# Patient Record
Sex: Female | Born: 1960 | Race: White | Hispanic: No | Marital: Married | Smoking: Former smoker
Health system: Southern US, Community
[De-identification: ages and names within clinical notes are randomized; demographics above are authoritative.]

## PROBLEM LIST (undated history)

## (undated) DIAGNOSIS — J8283 Eosinophilic asthma: Secondary | ICD-10-CM

## (undated) DIAGNOSIS — M8580 Other specified disorders of bone density and structure, unspecified site: Secondary | ICD-10-CM

## (undated) DIAGNOSIS — K4190 Unilateral femoral hernia, without obstruction or gangrene, not specified as recurrent: Secondary | ICD-10-CM

## (undated) DIAGNOSIS — I7300 Raynaud's syndrome without gangrene: Secondary | ICD-10-CM

## (undated) DIAGNOSIS — Z8742 Personal history of other diseases of the female genital tract: Secondary | ICD-10-CM

## (undated) DIAGNOSIS — Z973 Presence of spectacles and contact lenses: Secondary | ICD-10-CM

## (undated) DIAGNOSIS — D7218 Eosinophilia in diseases classified elsewhere: Secondary | ICD-10-CM

## (undated) DIAGNOSIS — Z202 Contact with and (suspected) exposure to infections with a predominantly sexual mode of transmission: Secondary | ICD-10-CM

## (undated) HISTORY — PX: UPPER GI ENDOSCOPY: SHX6162

## (undated) HISTORY — DX: Presence of spectacles and contact lenses: Z97.3

## (undated) HISTORY — DX: Other specified disorders of bone density and structure, unspecified site: M85.80

## (undated) HISTORY — DX: Contact with and (suspected) exposure to infections with a predominantly sexual mode of transmission: Z20.2

## (undated) HISTORY — DX: Eosinophilia in diseases classified elsewhere: D72.18

## (undated) HISTORY — DX: Raynaud's syndrome without gangrene: I73.00

## (undated) HISTORY — DX: Eosinophilic asthma: J82.83

## (undated) HISTORY — DX: Personal history of other diseases of the female genital tract: Z87.42

---

## 2001-07-20 ENCOUNTER — Emergency Department (HOSPITAL_COMMUNITY): Admission: EM | Admit: 2001-07-20 | Discharge: 2001-07-20 | Payer: Self-pay | Admitting: Emergency Medicine

## 2003-06-27 ENCOUNTER — Emergency Department (HOSPITAL_COMMUNITY): Admission: EM | Admit: 2003-06-27 | Discharge: 2003-06-27 | Payer: Self-pay | Admitting: Emergency Medicine

## 2003-11-04 ENCOUNTER — Other Ambulatory Visit: Admission: RE | Admit: 2003-11-04 | Discharge: 2003-11-04 | Payer: Self-pay | Admitting: Family Medicine

## 2004-11-03 ENCOUNTER — Other Ambulatory Visit: Admission: RE | Admit: 2004-11-03 | Discharge: 2004-11-03 | Payer: Self-pay | Admitting: Family Medicine

## 2005-11-17 ENCOUNTER — Other Ambulatory Visit: Admission: RE | Admit: 2005-11-17 | Discharge: 2005-11-17 | Payer: Self-pay | Admitting: Family Medicine

## 2006-09-20 ENCOUNTER — Ambulatory Visit: Payer: Self-pay | Admitting: Family Medicine

## 2006-11-06 HISTORY — PX: INGUINAL HERNIA REPAIR: SUR1180

## 2006-11-27 ENCOUNTER — Other Ambulatory Visit: Admission: RE | Admit: 2006-11-27 | Discharge: 2006-11-27 | Payer: Self-pay | Admitting: Family Medicine

## 2006-11-27 ENCOUNTER — Ambulatory Visit: Payer: Self-pay | Admitting: Family Medicine

## 2007-08-01 ENCOUNTER — Ambulatory Visit: Payer: Self-pay | Admitting: Family Medicine

## 2007-10-25 ENCOUNTER — Ambulatory Visit (HOSPITAL_BASED_OUTPATIENT_CLINIC_OR_DEPARTMENT_OTHER): Admission: RE | Admit: 2007-10-25 | Discharge: 2007-10-25 | Payer: Self-pay | Admitting: Surgery

## 2008-11-04 ENCOUNTER — Ambulatory Visit: Payer: Self-pay | Admitting: Family Medicine

## 2008-11-04 ENCOUNTER — Other Ambulatory Visit: Admission: RE | Admit: 2008-11-04 | Discharge: 2008-11-04 | Payer: Self-pay | Admitting: Family Medicine

## 2008-11-10 ENCOUNTER — Encounter: Admission: RE | Admit: 2008-11-10 | Discharge: 2008-11-10 | Payer: Self-pay | Admitting: Family Medicine

## 2009-11-04 ENCOUNTER — Ambulatory Visit: Payer: Self-pay | Admitting: Family Medicine

## 2009-11-04 ENCOUNTER — Other Ambulatory Visit: Admission: RE | Admit: 2009-11-04 | Discharge: 2009-11-04 | Payer: Self-pay | Admitting: Family Medicine

## 2009-12-07 HISTORY — PX: COLONOSCOPY: SHX174

## 2010-10-27 ENCOUNTER — Other Ambulatory Visit
Admission: RE | Admit: 2010-10-27 | Discharge: 2010-10-27 | Payer: Self-pay | Source: Home / Self Care | Admitting: Family Medicine

## 2010-10-27 ENCOUNTER — Ambulatory Visit: Payer: Self-pay | Admitting: Family Medicine

## 2011-03-21 NOTE — Op Note (Signed)
NAME:  Kathryn Myers, Kathryn Myers                ACCOUNT NO.:  [id number]   MEDICAL RECORD NO.:  [id number]          PATIENT TYPE:  AMB   LOCATION:  DSC                          FACILITY:  [facility]   PHYSICIAN:  Wilmon Arms. Corliss Skains, M.D. DATE OF BIRTH:  1961/09/07   DATE OF PROCEDURE:  10/25/2007  DATE OF DISCHARGE:                               OPERATIVE REPORT   PREOPERATIVE DIAGNOSIS:  Left inguinal hernia.   POSTOPERATIVE DIAGNOSIS:  Left femoral hernia.   PROCEDURE PERFORMED:  Left groin exploration and left femoral hernia  repair with mesh.   SURGEON:  Wilmon Arms. Corliss Skains, M.D., FACS   ANESTHESIA:  General via LMA.   INDICATIONS:  The patient is a healthy 50 year old female who presents  with an enlarging bulge in her left groin.  This has become fairly  uncomfortable.  This does reduce partially when she is supine.  She is  very thin and very active and travels a lot and this is beginning to  interfere with her level of activity.  She denies any GI obstructive  symptoms.   DESCRIPTION OF PROCEDURE:  The patient is brought to the operating room,  placed in supine position on the operating room table.  After an  adequate level of general anesthesia was obtained, the patient's left  groin was shaved and prepped with Betadine and draped in sterile  fashion.  A time-out was taken to assure the proper patient and proper  procedure.  The area above the left inguinal ligament was infiltrated  with 25% Marcaine.  An oblique incision was made above the inguinal  ligament.  Dissection was carried down to the external oblique fascia.  The external oblique fascia was opened.  We began to bluntly dissect  around the round ligament.  However, it became obvious that there was no  hernia in this area.  We then began exploring below the inguinal  ligament.  A large subcutaneous bulge was identified.  We bluntly  dissected around this bulge.  The bulge seemed to contain fluid as well  as what appeared to be  omentum.  However, I could not dissect down to  the base of this hernia sac.  Therefore, we opened the hernia sac.  Some  peritoneal fluid was encountered.  We opened the sac wider and  encountered some omentum.  I was unable to reduce the omentum up through  a very small femoral hernia defect just behind the pubic ramus.  The  omentum was ligated between hemostats and tied with 2-0 silk sutures.  A  right-angle clamp was placed into the small hernia defect and I was able  to the enlarge it slightly.  This allowed me to reduce the remaining  omentum.  At this point, the hernia defect measured about 7 mm across.  Using a 3 inch x 6 inch piece of Ultrapro mesh, I cut a circle measuring  about 2 inches in diameter.  I formed a cone from this mesh and inserted  it into the hernia defect.  It was secured with three interrupted 2-0  Vicryl sutures.  We then used  the remainder of the mesh to cut an oval  shape that we placed over the muscles of the inguinal canal.  These were  secured with interrupted 2-0 Vicryl sutures.  The external oblique  fascia was reapproximated with 2-0 Vicryl.  The fascia over the femoral  canal  was also closed with Vicryl.  A 3-0 Vicryl was used to close the  subcutaneous tissues and 4-0 Monocryl was used to close the skin.  Steri-  Strips and clean dressings were applied.  The patient was then extubated  and brought to the recovery room in stable condition.  All sponge,  instrument and needle counts correct.      Wilmon Arms. Tsuei, M.D.  Electronically Signed     MKT/MEDQ  D:  10/25/2007  T:  10/27/2007  Job:  [id number]

## 2011-08-11 LAB — DIFFERENTIAL
Basophils Absolute: 0
Basophils Relative: 1
Eosinophils Absolute: 0 — ABNORMAL LOW
Eosinophils Relative: 1
Lymphocytes Relative: 47 — ABNORMAL HIGH
Lymphs Abs: 3
Monocytes Absolute: 0.3
Monocytes Relative: 5
Neutro Abs: 3
Neutrophils Relative %: 47

## 2011-08-11 LAB — CBC
HCT: 40.3
Hemoglobin: 13.9
MCHC: 34.4
MCV: 95.2
Platelets: 285
RBC: 4.23
RDW: 12.3
WBC: 6.4

## 2011-08-11 LAB — BASIC METABOLIC PANEL
BUN: 8
CO2: 28
Calcium: 9.2
Chloride: 103
Creatinine, Ser: 0.8
GFR calc Af Amer: >60
GFR calc non Af Amer: >60
Glucose, Bld: 118 — ABNORMAL HIGH
Potassium: 3.9
Sodium: 138

## 2011-08-11 LAB — POCT HEMOGLOBIN-HEMACUE: Hemoglobin: 14.7

## 2011-10-26 ENCOUNTER — Encounter: Payer: Self-pay | Admitting: Internal Medicine

## 2011-10-27 ENCOUNTER — Encounter: Payer: Self-pay | Admitting: Medical

## 2011-10-27 ENCOUNTER — Ambulatory Visit (INDEPENDENT_AMBULATORY_CARE_PROVIDER_SITE_OTHER): Payer: Managed Care, Other (non HMO) | Admitting: Medical

## 2011-10-27 DIAGNOSIS — Z23 Encounter for immunization: Secondary | ICD-10-CM

## 2011-10-27 DIAGNOSIS — S56912A Strain of unspecified muscles, fascia and tendons at forearm level, left arm, initial encounter: Secondary | ICD-10-CM | POA: Insufficient documentation

## 2011-10-27 DIAGNOSIS — IMO0002 Reserved for concepts with insufficient information to code with codable children: Secondary | ICD-10-CM

## 2011-10-27 DIAGNOSIS — Z0000 Encounter for general adult medical examination without abnormal findings: Secondary | ICD-10-CM | POA: Insufficient documentation

## 2011-10-27 DIAGNOSIS — Z202 Contact with and (suspected) exposure to infections with a predominantly sexual mode of transmission: Secondary | ICD-10-CM | POA: Insufficient documentation

## 2011-10-27 LAB — POCT URINALYSIS DIPSTICK
Bilirubin, UA: NEGATIVE
Blood, UA: NEGATIVE
Glucose, UA: NEGATIVE
Ketones, UA: NEGATIVE
Leukocytes, UA: NEGATIVE
Nitrite, UA: NEGATIVE
Protein, UA: NEGATIVE
Spec Grav, UA: 1.015
Urobilinogen, UA: NEGATIVE
pH, UA: 7.5

## 2011-10-27 NOTE — Progress Notes (Addendum)
Subjective:   HPI  Kathryn Myers is a 50 y.o. female who presents for a complete physical.  Been doing well.  She is out of town frequently with her job. She is fasting today.  She has mammogram scheduled in January.   Her LMP was 9/12, prior to that 7/12.  She denies family of self hx/o osteoporosis/osteopenia.  She is taking Ca+Vit D and daily multivitamin.   Her husband is HIV+ and they use condoms.  Last 3 pap smears have been normal.  Reviewed their medical, surgical, family, social, medication, and allergy history and updated chart as appropriate.  Past Medical History  Diagnosis Date  . Allergic rhinitis   . Hemorrhoids   . Hx of abnormal cervical Pap smear   . Wears glasses     Past Surgical History  Procedure Date  . Inguinal hernia repair 2008    left    Family History  Problem Relation Age of Onset  . Hyperlipidemia Mother   . Parkinsonism Father   . Hyperlipidemia Father   . Hypertension Father   . Heart disease Father   . Cancer Brother     bone cancer as a child  . Cancer Paternal Uncle     pancreatic  . Heart disease Maternal Grandfather   . Stroke Neg Hx   . Diabetes Neg Hx     History   Social History  . Marital Status: Married    Spouse Name: N/A    Number of Children: N/A  . Years of Education: N/A   Occupational History  . procurement supervisor    Social History Main Topics  . Smoking status: Former Smoker -- 0.2 packs/day for 10 years    Quit date: 10/27/1991  . Smokeless tobacco: Not on file  . Alcohol Use: 4.2 oz/week    7 Glasses of wine per week  . Drug Use: No  . Sexually Active: Not on file   Other Topics Concern  . Not on file   Social History Narrative   Married, no children, exercise - walks the dog, travels a lot for work, out of town most of the time    No current outpatient prescriptions on file prior to visit.    No Known Allergies  Review of Systems Constitutional: -fever, -chills, -sweats, -unexpected weight  change, -anorexia, -fatigue Allergy: -sneezing, -itching, -congestion Dermatology: denies changing moles, rash, lumps, new worrisome lesions ENT: -runny nose, -ear pain, -sore throat, -hoarseness, -sinus pain, -teeth pain, -tinnitus, -hearing loss, -epistaxis Cardiology:  -chest pain, -palpitations, -edema, -orthopnea, -paroxysmal nocturnal dyspnea Respiratory: -cough, -shortness of breath, -dyspnea on exertion, -wheezing, -hemoptysis Gastroenterology: -abdominal pain, -nausea, -vomiting, -diarrhea, -constipation, -blood in stool, -changes in bowel movement, -dysphagia Hematology: -bleeding or bruising problems Musculoskeletal: -arthralgias, -myalgias, +joint swelling, -back pain, -neck pain, -cramping, -gait changes Ophthalmology: -vision changes, -eye redness, -itching, -discharge Urology: -dysuria, -difficulty urinating, -hematuria, -urinary frequency, -urgency, incontinence Neurology: -headache, -weakness, -tingling, -numbness, -speech abnormality, -memory loss, -falls, -dizziness Psychology:  -depressed mood, -agitation, -sleep problems    Objective:   Physical Exam  Filed Vitals:   10/27/11 0849  BP: 112/80  Pulse: 68  Temp: 97.8 F (36.6 C)  Resp: 12    General appearance: alert, no distress, WD/WN, lean white female Skin: unremarkable, no worrisome lesions HEENT: normocephalic, conjunctiva/corneas normal, sclerae anicteric, PERRLA, EOMi, nares patent, no discharge or erythema, pharynx normal Oral cavity: MMM, tongue normal, teeth in good repair Neck: supple, no lymphadenopathy, no thyromegaly, no masses, normal ROM, no bruits  Chest: non tender, normal shape and expansion Heart: RRR, normal S1, S2, no murmurs Lungs: CTA bilaterally, no wheezes, rhonchi, or rales Abdomen: +bs, soft, non tender, non distended, no masses, no hepatomegaly, no splenomegaly, no bruits Back: non tender, normal ROM, no scoliosis Musculoskeletal: tender over left forearm just distal to lateral  elbow, pain with resisted ROM, otherwise upper extremities non tender, no obvious deformity, normal ROM throughout, lower extremities non tender, no obvious deformity, normal ROM throughout Extremities: no edema, no cyanosis, no clubbing Pulses: 2+ symmetric, upper and lower extremities, normal cap refill Neurological: alert, oriented x 3, CN2-12 intact, strength normal upper extremities and lower extremities, sensation normal throughout, DTRs 2+ throughout, no cerebellar signs, gait normal Psychiatric: normal affect, behavior normal, pleasant  Breast: nontender, no masses or lumps, no skin changes, no nipple discharge or inversion, no axillary lymphadenopathy Gyn: Normal external genitalia without lesions, vagina with normal mucosa, cervix without lesions, no cervical motion tenderness, no abnormal vaginal discharge.  Uterus and adnexa not enlarged, nontender, no masses.  Pap performed.  Exam chaperoned by nurse. Rectal: anus normal, occult negative brown stool   Assessment and Plan :     Encounter Diagnoses  Name Primary?  . General medical examination Yes  . Venereal disease contact   . Need for prophylactic vaccination and inoculation against influenza   . Strain of forearm, left     Physical exam - discussed healthy lifestyle, diet, exercise, preventative care, vaccinations, and addressed their concerns.  Handout given.  Colonoscopy 12/2009.  Venereal disease contact - HIV testing as her husband is HIV+.  Flu shot, vaccine counseling, and VIS given.   Strain of forearm - advised rest, OTC Aleve, consider OTZC tennis elbow strap, gentle ROM, and if worse or not computer, recheck.   Follow-up pending labs.

## 2011-10-27 NOTE — Patient Instructions (Signed)

## 2011-10-28 LAB — CBC WITH DIFFERENTIAL/PLATELET
Basophils Absolute: 0 K/uL (ref 0.0–0.1)
Basophils Relative: 1 % (ref 0–1)
Eosinophils Absolute: 0.1 K/uL (ref 0.0–0.7)
Eosinophils Relative: 1 % (ref 0–5)
HCT: 40.7 % (ref 36.0–46.0)
Hemoglobin: 14 g/dL (ref 12.0–15.0)
Lymphocytes Relative: 42 % (ref 12–46)
Lymphs Abs: 2.4 K/uL (ref 0.7–4.0)
MCH: 33.4 pg (ref 26.0–34.0)
MCHC: 34.4 g/dL (ref 30.0–36.0)
MCV: 97.1 fL (ref 78.0–100.0)
Monocytes Absolute: 0.4 K/uL (ref 0.1–1.0)
Monocytes Relative: 7 % (ref 3–12)
Neutro Abs: 2.9 K/uL (ref 1.7–7.7)
Neutrophils Relative %: 50 % (ref 43–77)
Platelets: 264 K/uL (ref 150–400)
RBC: 4.19 MIL/uL (ref 3.87–5.11)
RDW: 13.3 % (ref 11.5–15.5)
WBC: 5.8 K/uL (ref 4.0–10.5)

## 2011-10-28 LAB — HIV ANTIBODY (ROUTINE TESTING W REFLEX): HIV: NONREACTIVE

## 2011-10-28 LAB — COMPREHENSIVE METABOLIC PANEL WITH GFR
ALT: 14 U/L (ref 0–35)
AST: 21 U/L (ref 0–37)
Albumin: 4.6 g/dL (ref 3.5–5.2)
Alkaline Phosphatase: 52 U/L (ref 39–117)
BUN: 12 mg/dL (ref 6–23)
CO2: 26 meq/L (ref 19–32)
Calcium: 10.1 mg/dL (ref 8.4–10.5)
Chloride: 101 meq/L (ref 96–112)
Creat: 0.86 mg/dL (ref 0.50–1.10)
Glucose, Bld: 84 mg/dL (ref 70–99)
Potassium: 4.2 meq/L (ref 3.5–5.3)
Sodium: 140 meq/L (ref 135–145)
Total Bilirubin: 1 mg/dL (ref 0.3–1.2)
Total Protein: 7.2 g/dL (ref 6.0–8.3)

## 2011-10-28 LAB — LIPID PANEL
Cholesterol: 235 mg/dL — ABNORMAL HIGH (ref 0–200)
HDL: 85 mg/dL (ref >39)
LDL Cholesterol: 139 mg/dL — ABNORMAL HIGH (ref 0–99)
Total CHOL/HDL Ratio: 2.8 ratio
Triglycerides: 57 mg/dL (ref <150)
VLDL: 11 mg/dL (ref 0–40)

## 2011-11-03 ENCOUNTER — Telehealth: Payer: Self-pay | Admitting: Family Medicine

## 2011-11-03 NOTE — Telephone Encounter (Signed)
Message copied by Janeice Robinson on Fri Nov 03, 2011 11:09 AM ------      Message from: Jac Canavan      Created: Thu Nov 02, 2011  7:12 AM       Labs all look ok.  HIV negative, liver, kidney, blood counts, cholesterol, urine all ok.  I recommend she exercise regularly, eat healthy low fat diet, take daily calcium with vitamin D, take daily multivitamin, and continue to use condoms.  Lets reconsider bone density scan in a year.  At this point I don't think she has significant risk to do a bone density scan.  Certainly the recommendation is regular exercise and dietary Calcium + Vit D.            See if she has any questions.

## 2011-11-08 ENCOUNTER — Telehealth: Payer: Self-pay | Admitting: Family Medicine

## 2011-11-08 NOTE — Telephone Encounter (Signed)
Message copied by Janeice Robinson on Wed Nov 08, 2011  2:40 PM ------      Message from: Aleen Campi, DAVID S      Created: Thu Nov 02, 2011  7:12 AM       Labs all look ok.  HIV negative, liver, kidney, blood counts, cholesterol, urine all ok.  I recommend she exercise regularly, eat healthy low fat diet, take daily calcium with vitamin D, take daily multivitamin, and continue to use condoms.  Lets reconsider bone density scan in a year.  At this point I don't think she has significant risk to do a bone density scan.  Certainly the recommendation is regular exercise and dietary Calcium + Vit D.            See if she has any questions.

## 2011-11-08 NOTE — Telephone Encounter (Signed)
PATIENT WAS NOTIFIED OF HER LAB RESULTS. CLS   PATIENT STATES THAT SHE DIDN'T HAVE ANY OTHER QUESTIONS OR CONCERNS. CLS

## 2012-01-22 LAB — HM MAMMOGRAPHY: HM Mammogram: NEGATIVE

## 2012-01-23 ENCOUNTER — Encounter: Payer: Self-pay | Admitting: Internal Medicine

## 2012-08-30 ENCOUNTER — Other Ambulatory Visit (INDEPENDENT_AMBULATORY_CARE_PROVIDER_SITE_OTHER): Payer: 59

## 2012-08-30 ENCOUNTER — Telehealth: Payer: Self-pay | Admitting: Family Medicine

## 2012-08-30 DIAGNOSIS — Z23 Encounter for immunization: Secondary | ICD-10-CM

## 2012-08-30 NOTE — Telephone Encounter (Signed)
TSD  

## 2012-10-21 ENCOUNTER — Other Ambulatory Visit (HOSPITAL_COMMUNITY)
Admission: RE | Admit: 2012-10-21 | Discharge: 2012-10-21 | Disposition: A | Discharge status: Home / Self Care | Payer: 59 | Source: Ambulatory Visit | Attending: Obstetrics and Gynecology | Admitting: Obstetrics and Gynecology

## 2012-10-21 ENCOUNTER — Ambulatory Visit (INDEPENDENT_AMBULATORY_CARE_PROVIDER_SITE_OTHER): Payer: 59 | Admitting: Medical

## 2012-10-21 ENCOUNTER — Encounter: Payer: Self-pay | Admitting: Medical

## 2012-10-21 VITALS — BP 122/80 | HR 62 | Temp 98.0°F | Resp 16 | Ht 68.0 in | Wt 123.0 lb

## 2012-10-21 DIAGNOSIS — Z124 Encounter for screening for malignant neoplasm of cervix: Secondary | ICD-10-CM

## 2012-10-21 DIAGNOSIS — Z01419 Encounter for gynecological examination (general) (routine) without abnormal findings: Secondary | ICD-10-CM | POA: Insufficient documentation

## 2012-10-21 DIAGNOSIS — Z8262 Family history of osteoporosis: Secondary | ICD-10-CM

## 2012-10-21 DIAGNOSIS — Z2089 Contact with and (suspected) exposure to other communicable diseases: Secondary | ICD-10-CM

## 2012-10-21 DIAGNOSIS — A64 Unspecified sexually transmitted disease: Secondary | ICD-10-CM

## 2012-10-21 DIAGNOSIS — Z780 Asymptomatic menopausal state: Secondary | ICD-10-CM

## 2012-10-21 DIAGNOSIS — Z0000 Encounter for general adult medical examination without abnormal findings: Secondary | ICD-10-CM

## 2012-10-21 DIAGNOSIS — Z202 Contact with and (suspected) exposure to infections with a predominantly sexual mode of transmission: Secondary | ICD-10-CM

## 2012-10-21 LAB — CBC WITH DIFFERENTIAL/PLATELET
Basophils Absolute: 0.1 10*3/uL (ref 0.0–0.1)
Basophils Relative: 1 % (ref 0–1)
Eosinophils Absolute: 0 10*3/uL (ref 0.0–0.7)
Eosinophils Relative: 1 % (ref 0–5)
HCT: 40.4 % (ref 36.0–46.0)
Hemoglobin: 14.1 g/dL (ref 12.0–15.0)
Lymphocytes Relative: 44 % (ref 12–46)
Lymphs Abs: 3 10*3/uL (ref 0.7–4.0)
MCH: 32.7 pg (ref 26.0–34.0)
MCHC: 34.9 g/dL (ref 30.0–36.0)
MCV: 93.7 fL (ref 78.0–100.0)
Monocytes Absolute: 0.4 10*3/uL (ref 0.1–1.0)
Monocytes Relative: 5 % (ref 3–12)
Neutro Abs: 3.3 10*3/uL (ref 1.7–7.7)
Neutrophils Relative %: 49 % (ref 43–77)
Platelets: 279 10*3/uL (ref 150–400)
RBC: 4.31 MIL/uL (ref 3.87–5.11)
RDW: 13.2 % (ref 11.5–15.5)
WBC: 6.7 10*3/uL (ref 4.0–10.5)

## 2012-10-21 LAB — POCT URINALYSIS DIPSTICK
Bilirubin, UA: NEGATIVE
Glucose, UA: NEGATIVE
Leukocytes, UA: NEGATIVE
Nitrite, UA: NEGATIVE
Spec Grav, UA: 1.02
Urobilinogen, UA: NEGATIVE
pH, UA: 5

## 2012-10-21 NOTE — Progress Notes (Addendum)
Subjective:   HPI  Kathryn Myers is a 51 y.o. female who presents for a complete physical.  Last visit a year ago for the same.  Feeling healthy, no particularly c/o.  Travels internationally and throughout the [place] often for work.  Eats healthy, gets exercise. Sees dermatology twice yearly for surveillance.  LMP a year ago.  Had some hot flashes but these resolved.  No particular menopausal symptoms currently.   Preventative care: Last ophthalmology visit:01/2012 Last dental visit:10/22/12 Last colonoscopy:01/02/10 Last mammogram:01/22/12 Last gynecological exam:10/27/10 Last EKG:11/04/09 Last labs:10/2011  Prior vaccinations: TD or Tdap:08/2012 Influenza:08/30/12 Pneumococcal:N/A Shingles/Zostavax:N/A  Concerns: Wonders if she needs bone density screening.  Mother and MGM both with hx/o osteoporosis.    Reviewed their medical, surgical, family, social, medication, and allergy history and updated chart as appropriate.   Past Medical History  Diagnosis Date  . Allergic rhinitis   . Hemorrhoids   . Hx of abnormal cervical Pap smear   . Wears glasses   . Venereal disease contact     uses condoms, husband is HIV+  . H/O mammogram 01/22/12    normal    Past Surgical History  Procedure Date  . Inguinal hernia repair 2008    left  . Colonoscopy 12/2009    Dr. Elnoria Howard; sessile polyp, medium hemorrhoids, diverticulum    Family History  Problem Relation Age of Onset  . Hyperlipidemia Mother   . Osteoporosis Mother   . Parkinsonism Father   . Hyperlipidemia Father   . Hypertension Father   . Heart disease Father   . Cancer Brother     bone cancer as a child  . Cancer Paternal Uncle     pancreatic  . Heart disease Maternal Grandfather   . Stroke Neg Hx   . Diabetes Neg Hx   . Osteoporosis Maternal Grandmother      History   Social History  . Marital Status: Married    Spouse Name: N/A    Number of Children: N/A  . Years of Education: N/A   Occupational History  .  procurement supervisor    Social History Main Topics  . Smoking status: Former Smoker -- 0.2 packs/day for 10 years    Quit date: 10/27/1991  . Smokeless tobacco: Not on file  . Alcohol Use: 4.2 oz/week    7 Glasses of wine per week  . Drug Use: No  . Sexually Active: Not on file   Other Topics Concern  . 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  Travels to [place], [place], [place].      Current Outpatient Prescriptions on File Prior to Visit  Medication Sig Dispense Refill  . Multiple Vitamin (MULTIVITAMIN) tablet Take 1 tablet by mouth daily.          No Known Allergies   Review of Systems Constitutional: -fever, -chills, -sweats, -unexpected weight change, -decreased appetite, -fatigue Allergy: -sneezing, -itching, -congestion Dermatology: -changing moles, --rash, -lumps ENT: -runny nose, -ear pain, -sore throat, -hoarseness, -sinus pain, -teeth pain, - ringing in ears, -hearing loss, -nosebleeds Cardiology: -chest pain, -palpitations, -swelling, -difficulty breathing when lying flat, -waking up short of breath Respiratory: -cough, -shortness of breath, -difficulty breathing with exercise or exertion, -wheezing, -coughing up blood Gastroenterology: -abdominal pain, -nausea, -vomiting, -diarrhea, -constipation, -blood in stool, -changes in bowel movement, -difficulty swallowing or eating Hematology: -bleeding, -  bruising  Musculoskeletal: -joint aches, -muscle aches, -joint swelling, -back pain, -neck pain, -cramping, -changes in gait Ophthalmology: denies vision changes, eye redness, itching, discharge Urology: -burning with urination, -difficulty urinating, -blood in urine, -urinary frequency, -urgency, -incontinence Neurology: -headache, -weakness, -tingling, -numbness, -memory loss, -falls, -dizziness Psychology: -depressed  mood, -agitation, -sleep problems     Objective:   Physical Exam  Nurse notes and vitals reviewed.   General appearance: alert, no distress, WD/WN, lean white female  Skin: scattered macules, followed by dermatology, no specific worrisome lesion HEENT: normocephalic, conjunctiva/corneas normal, sclerae anicteric, PERRLA, EOMi, nares patent, no discharge or erythema, pharynx normal  Oral cavity: MMM, tongue normal, teeth in good repair  Neck: supple, no lymphadenopathy, no thyromegaly, no masses, normal ROM, no bruits  Chest: non tender, normal shape and expansion  Heart: RRR, normal S1, S2, no murmurs  Lungs: CTA bilaterally, no wheezes, rhonchi, or rales  Abdomen: +bs, soft, non tender, non distended, no masses, no hepatomegaly, no splenomegaly, no bruits  Back: non tender, normal ROM, no scoliosis  Musculoskeletal: tender over left forearm just distal to lateral elbow, pain with resisted ROM, otherwise upper extremities non tender, no obvious deformity, normal ROM throughout, lower extremities non tender, no obvious deformity, normal ROM throughout  Extremities: no edema, no cyanosis, no clubbing  Pulses: 2+ symmetric, upper and lower extremities, normal cap refill  Neurological: alert, oriented x 3, CN2-12 intact, strength normal upper extremities and lower extremities, sensation normal throughout, DTRs 2+ throughout, no cerebellar signs, gait normal  Psychiatric: normal affect, behavior normal, pleasant  Breast: nontender, no masses or lumps, no skin changes, no nipple discharge or inversion, no axillary lymphadenopathy  Gyn: Normal external genitalia without lesions, vagina with normal mucosa, cervix without lesions, no cervical motion tenderness, no abnormal vaginal discharge. Uterus and adnexa not enlarged, nontender, no masses. Pap performed. Exam chaperoned by nurse.  Rectal: anus normal, occult negative brown stool   Assessment and Plan :    Encounter Diagnoses  Name Primary?   . Routine general medical examination at a health care facility Yes  . Venereal disease contact   . Screening for cervical cancer   . Postmenopausal   . Family history of osteoporosis   . Venereal disease     Physical exam - discussed healthy lifestyle, diet, exercise, preventative care, vaccinations, and addressed their concerns.  Handout given.  HIV screening.  She continues to use condoms, husband HIV+ and she is HIV negative.    Pap sent.   Will set up for baseline bone density testing: risk factors include: white female, family history of osteoporosis, alcohol use, postmenopausal, small frame.  Follow-up pending labs.

## 2012-10-22 LAB — COMPREHENSIVE METABOLIC PANEL
ALT: 14 U/L (ref 0–35)
AST: 20 U/L (ref 0–37)
Albumin: 4.4 g/dL (ref 3.5–5.2)
Alkaline Phosphatase: 52 U/L (ref 39–117)
BUN: 15 mg/dL (ref 6–23)
CO2: 22 mEq/L (ref 19–32)
Calcium: 9.3 mg/dL (ref 8.4–10.5)
Chloride: 101 mEq/L (ref 96–112)
Creat: 0.72 mg/dL (ref 0.50–1.10)
Glucose, Bld: 81 mg/dL (ref 70–99)
Potassium: 3.9 mEq/L (ref 3.5–5.3)
Sodium: 137 mEq/L (ref 135–145)
Total Bilirubin: 0.9 mg/dL (ref 0.3–1.2)
Total Protein: 6.8 g/dL (ref 6.0–8.3)

## 2012-10-22 LAB — LIPID PANEL
Cholesterol: 232 mg/dL — ABNORMAL HIGH (ref 0–200)
HDL: 86 mg/dL (ref >39)
LDL Cholesterol: 132 mg/dL — ABNORMAL HIGH (ref 0–99)
Total CHOL/HDL Ratio: 2.7 Ratio
Triglycerides: 71 mg/dL (ref <150)
VLDL: 14 mg/dL (ref 0–40)

## 2012-10-22 LAB — VITAMIN D 25 HYDROXY (VIT D DEFICIENCY, FRACTURES): Vit D, 25-Hydroxy: 53 ng/mL (ref 30–89)

## 2012-10-22 LAB — TSH: TSH: 3.142 u[IU]/mL (ref 0.350–4.500)

## 2012-10-22 LAB — T4, FREE: Free T4: 1.35 ng/dL (ref 0.80–1.80)

## 2012-10-22 LAB — HIV ANTIBODY (ROUTINE TESTING W REFLEX): HIV: NONREACTIVE

## 2012-10-25 ENCOUNTER — Encounter: Payer: Self-pay | Admitting: Family Medicine

## 2012-11-06 DIAGNOSIS — M8580 Other specified disorders of bone density and structure, unspecified site: Secondary | ICD-10-CM

## 2012-11-06 HISTORY — DX: Other specified disorders of bone density and structure, unspecified site: M85.80

## 2012-11-08 ENCOUNTER — Telehealth: Payer: Self-pay | Admitting: Family Medicine

## 2012-11-08 ENCOUNTER — Other Ambulatory Visit: Payer: Self-pay | Admitting: Family Medicine

## 2012-11-08 DIAGNOSIS — Z780 Asymptomatic menopausal state: Secondary | ICD-10-CM

## 2012-11-08 NOTE — Telephone Encounter (Signed)
Patient is aware of her appointment at [facility] on 11/25/12 @ 915 am. [phone] CLS

## 2012-11-20 ENCOUNTER — Ambulatory Visit
Admission: RE | Admit: 2012-11-20 | Discharge: 2012-11-20 | Disposition: A | Discharge status: Home / Self Care | Payer: 59 | Source: Ambulatory Visit | Attending: Medical | Admitting: Medical

## 2012-11-20 DIAGNOSIS — Z780 Asymptomatic menopausal state: Secondary | ICD-10-CM

## 2012-11-20 LAB — HM DEXA SCAN

## 2012-11-25 ENCOUNTER — Other Ambulatory Visit: Payer: 59

## 2013-06-16 LAB — HM MAMMOGRAPHY

## 2013-06-17 ENCOUNTER — Encounter: Payer: Self-pay | Admitting: Internal Medicine

## 2013-09-11 ENCOUNTER — Other Ambulatory Visit: Payer: Self-pay

## 2013-10-27 ENCOUNTER — Ambulatory Visit (INDEPENDENT_AMBULATORY_CARE_PROVIDER_SITE_OTHER): Payer: 59 | Admitting: Medical

## 2013-10-27 ENCOUNTER — Encounter: Payer: Self-pay | Admitting: Medical

## 2013-10-27 VITALS — BP 110/80 | HR 60 | Temp 98.2°F | Resp 16 | Ht 68.0 in | Wt 122.0 lb

## 2013-10-27 DIAGNOSIS — Z0000 Encounter for general adult medical examination without abnormal findings: Secondary | ICD-10-CM

## 2013-10-27 DIAGNOSIS — Z129 Encounter for screening for malignant neoplasm, site unspecified: Secondary | ICD-10-CM

## 2013-10-27 DIAGNOSIS — Z2089 Contact with and (suspected) exposure to other communicable diseases: Secondary | ICD-10-CM

## 2013-10-27 DIAGNOSIS — Z202 Contact with and (suspected) exposure to infections with a predominantly sexual mode of transmission: Secondary | ICD-10-CM

## 2013-10-27 DIAGNOSIS — Z23 Encounter for immunization: Secondary | ICD-10-CM

## 2013-10-27 LAB — LIPID PANEL
Cholesterol: 242 mg/dL — ABNORMAL HIGH (ref 0–200)
HDL: 84 mg/dL (ref >39)
LDL Cholesterol: 144 mg/dL — ABNORMAL HIGH (ref 0–99)
Total CHOL/HDL Ratio: 2.9 Ratio
Triglycerides: 70 mg/dL (ref <150)
VLDL: 14 mg/dL (ref 0–40)

## 2013-10-27 LAB — COMPREHENSIVE METABOLIC PANEL
ALT: 15 U/L (ref 0–35)
AST: 24 U/L (ref 0–37)
Albumin: 4.4 g/dL (ref 3.5–5.2)
Alkaline Phosphatase: 80 U/L (ref 39–117)
BUN: 14 mg/dL (ref 6–23)
CO2: 30 mEq/L (ref 19–32)
Calcium: 10.4 mg/dL (ref 8.4–10.5)
Chloride: 100 mEq/L (ref 96–112)
Creat: 0.86 mg/dL (ref 0.50–1.10)
Glucose, Bld: 96 mg/dL (ref 70–99)
Potassium: 4.4 mEq/L (ref 3.5–5.3)
Sodium: 140 mEq/L (ref 135–145)
Total Bilirubin: 0.5 mg/dL (ref 0.3–1.2)
Total Protein: 7.1 g/dL (ref 6.0–8.3)

## 2013-10-27 LAB — POCT URINALYSIS DIPSTICK
Bilirubin, UA: NEGATIVE
Blood, UA: NEGATIVE
Glucose, UA: NEGATIVE
Ketones, UA: NEGATIVE
Leukocytes, UA: NEGATIVE
Nitrite, UA: NEGATIVE
Protein, UA: NEGATIVE
Spec Grav, UA: <=1.005
Urobilinogen, UA: NEGATIVE
pH, UA: 7

## 2013-10-27 LAB — CBC
HCT: 42.3 % (ref 36.0–46.0)
Hemoglobin: 14.5 g/dL (ref 12.0–15.0)
MCH: 32.3 pg (ref 26.0–34.0)
MCHC: 34.3 g/dL (ref 30.0–36.0)
MCV: 94.2 fL (ref 78.0–100.0)
Platelets: 291 10*3/uL (ref 150–400)
RBC: 4.49 MIL/uL (ref 3.87–5.11)
RDW: 13.2 % (ref 11.5–15.5)
WBC: 6.1 10*3/uL (ref 4.0–10.5)

## 2013-10-27 LAB — TSH: TSH: 3.072 u[IU]/mL (ref 0.350–4.500)

## 2013-10-27 NOTE — Progress Notes (Signed)
Subjective:   HPI  Kathryn Myers is a 52 y.o. female who presents for a complete physical.   Preventative care: Last ophthalmology visit:yes- last eye exam- 01/2013- Dr.Scott Last dental visit:yes Last colonoscopy: 01/02/2010 Last mammogram: 8 2014 Last gynecological exam:10/2012 Last EKG:11/04/2009 Last labs:2013  Prior vaccinations: TD or Tdap:08/2012 Influenza:10/27/13 Pneumococcal:n/a Shingles/Zostavax:n/a Other: bone density- 11/29/12 Sees dermatology - Dr. Terri Piedra once a year  Advanced directive:n/a Health care power of attorney:n/a Living will:yes  Concerns:none  Doing well, no c/o.   Wants routine yearly lab screening as well.   Exercises, eats healthy.  Reviewed their medical, surgical, family, social, medication, and allergy history and updated chart as appropriate.  Past Medical History  Diagnosis Date  . Allergic rhinitis   . Hemorrhoids   . Hx of abnormal cervical Pap smear   . Wears glasses   . Venereal disease contact     uses condoms, husband is HIV+  . H/O mammogram 01/22/12    normal  . Osteopenia 11/2012    repeat in 3-5 years    Past Surgical History  Procedure Laterality Date  . Inguinal hernia repair  2008    left  . Colonoscopy  12/2009    Dr. Elnoria Howard; sessile polyp, medium hemorrhoids, diverticulum    History   Social History  . Marital Status: Married    Spouse Name: N/A    Number of Children: N/A  . Years of Education: N/A   Occupational History  . procurement supervisor    Social History Main Topics  . Smoking status: Former Smoker -- 0.25 packs/day for 10 years    Quit date: 10/27/1991  . Smokeless tobacco: Not on file  . Alcohol Use: 4.2 oz/week    7 Glasses of wine per week  . Drug Use: No  . Sexual Activity: Not on file   Other Topics Concern  . 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  Travels to [place], [place], [place].      Family History  Problem Relation Age of Onset  . Hyperlipidemia Mother   . Osteoporosis Mother   . Parkinsonism Father   . Hyperlipidemia Father   . Hypertension Father   . Heart disease Father   . Cancer Brother     bone cancer as a child  . Cancer Paternal Uncle     pancreatic  . Heart disease Maternal Grandfather   . Stroke Neg Hx   . Diabetes Neg Hx   . Osteoporosis Maternal Grandmother     Current outpatient prescriptions:Multiple Vitamin (MULTIVITAMIN) tablet, Take 1 tablet by mouth daily.  , Disp: , Rfl:   No Known Allergies   Review of Systems Constitutional: -fever, -chills, -sweats, -unexpected weight change, -decreased appetite, -fatigue Allergy: -sneezing, -itching, -congestion Dermatology: -changing moles, --rash, -lumps ENT: -runny nose, -ear pain, -sore throat, -hoarseness, -sinus pain, -teeth pain, - ringing in ears, -hearing loss, -nosebleeds Cardiology: -chest pain, -palpitations, -swelling, -difficulty breathing when lying flat, -waking up short of breath Respiratory: -cough, -shortness of breath, -difficulty breathing with exercise or exertion, -wheezing, -coughing up blood Gastroenterology: -abdominal pain, -nausea, -vomiting, -diarrhea, -constipation, -blood in stool, -changes in bowel movement, -difficulty swallowing or eating Hematology: -bleeding, -bruising  Musculoskeletal: -joint aches, -muscle aches, -joint swelling, -back pain, -neck pain, -cramping, -changes in gait Ophthalmology: denies vision changes, eye redness, itching, discharge Urology: -  burning with urination, -difficulty urinating, -blood in urine, -urinary frequency, -urgency, -incontinence Neurology: -headache, -weakness, -tingling, -numbness, -memory loss, -falls, -dizziness Psychology: -depressed mood, -agitation, -sleep problems     Objective:   Physical Exam  BP 110/80  Pulse 60  Temp(Src) 98.2 F (36.8 C) (Oral)   Resp 16  Ht 5\' 8"  (1.727 m)  Wt 122 lb (55.339 kg)  BMI 18.55 kg/m2  General appearance: alert, no distress, WD/WN, lean white female Skin: scattered brown macules throughout, no particular worrisome lesions HEENT: normocephalic, conjunctiva/corneas normal, sclerae anicteric, PERRLA, EOMi, nares patent, no discharge or erythema, pharynx normal Oral cavity: MMM, tongue normal, teeth in good repair Neck: supple, no lymphadenopathy, no thyromegaly, no masses, normal ROM, no bruits Chest: non tender, normal shape and expansion Heart: RRR, normal S1, S2, no murmurs Lungs: CTA bilaterally, no wheezes, rhonchi, or rales Abdomen: +bs, soft, non tender, non distended, no masses, no hepatomegaly, no splenomegaly, no bruits Back: non tender, normal ROM, no scoliosis Musculoskeletal: upper extremities non tender, no obvious deformity, normal ROM throughout, lower extremities non tender, no obvious deformity, normal ROM throughout Extremities: no edema, no cyanosis, no clubbing Pulses: 2+ symmetric, upper and lower extremities, normal cap refill Neurological: alert, oriented x 3, CN2-12 intact, strength normal upper extremities and lower extremities, sensation normal throughout, DTRs 2+ throughout, no cerebellar signs, gait normal Psychiatric: normal affect, behavior normal, pleasant  Breast: nontender, no masses or lumps, no skin changes, no nipple discharge or inversion, no axillary lymphadenopathy Gyn: Normal external genitalia without lesions, vagina with normal mucosa, cervix without lesions, no cervical motion tenderness, no abnormal vaginal discharge.  Uterus and adnexa not enlarged, nontender, no masses.  Pap performed.  Exam chaperoned by nurse. Rectal: anus normal tone, occult negative stool, no worrisome lesions    Assessment and Plan :    Encounter Diagnoses  Name Primary?  . Routine general medical examination at a health care facility Yes  . Need for prophylactic vaccination and  inoculation against influenza   . Venereal disease contact   . Screening for cancer     Physical exam - discussed healthy lifestyle, diet, exercise, preventative care, vaccinations, and addressed their concerns.  Handout given. Counseled on the influenza virus vaccine.  Vaccine information sheet given.  Influenza vaccine given after consent obtained. Follow-up pending labs

## 2013-10-27 NOTE — Patient Instructions (Signed)

## 2013-10-28 LAB — RPR

## 2013-10-28 LAB — HIV ANTIBODY (ROUTINE TESTING W REFLEX): HIV: NONREACTIVE

## 2014-03-12 ENCOUNTER — Encounter: Payer: Self-pay | Admitting: Family Medicine

## 2014-03-12 ENCOUNTER — Ambulatory Visit (INDEPENDENT_AMBULATORY_CARE_PROVIDER_SITE_OTHER): Payer: 59 | Admitting: Family Medicine

## 2014-03-12 VITALS — BP 100/70 | HR 60 | Wt 118.0 lb

## 2014-03-12 DIAGNOSIS — M25529 Pain in unspecified elbow: Secondary | ICD-10-CM

## 2014-03-12 DIAGNOSIS — M67919 Unspecified disorder of synovium and tendon, unspecified shoulder: Secondary | ICD-10-CM

## 2014-03-12 DIAGNOSIS — M7581 Other shoulder lesions, right shoulder: Secondary | ICD-10-CM

## 2014-03-12 DIAGNOSIS — M719 Bursopathy, unspecified: Secondary | ICD-10-CM

## 2014-03-12 MED ORDER — LIDOCAINE HCL (PF) 2 % IJ SOLN
3.0000 mL | Freq: Once | INTRAMUSCULAR | Status: AC
  Administered 2014-03-12 (×2): 3 mL

## 2014-03-12 MED ORDER — TRIAMCINOLONE ACETONIDE 40 MG/ML IJ SUSP: 40.0000 mg | Freq: Once | INTRAMUSCULAR | Status: DC

## 2014-03-12 NOTE — Patient Instructions (Signed)
Return here if any problems.

## 2014-03-12 NOTE — Progress Notes (Signed)
   Subjective:    Patient ID: Kathryn Myers, female    DOB: 1960-12-24, 53 y.o.   MRN: [id number]  HPI  She is here for evaluation of right arm pain that started in February. At that time she was helping take care of her father which require a lot of physical work. She does not remember any particular incident. She notes that abduction does make this worse. She does occasionally wake up with pain if she lies on the right shoulder. She is right-handed   Review of Systems     Objective:   Physical Exam Full motion of the left shoulder however pain was elicited past horizontal. Negative drop arm test. Supraspinatus testing was negative. Neer's and Hawkins test is negative. No joint laxity noted.      Assessment & Plan:  Pain in joint, upper arm - Plan: lidocaine (XYLOCAINE) 2 % injection 3 mL, triamcinolone acetonide (KENALOG-40) injection 40 mg  Right rotator cuff tendinitis  explained options to her including conservative care with heat, rest and in safe. Also discussed injection. She would like to have the injection. 40 mg of Kenalog and 3 cc of Xylocaine was injected into the subacromial bursa, posterior lateral approach after Betadine was used to clean the skin. She did obtain partial relief of her symptoms within 5 minutes. Recommend returning here if symptoms recur.

## 2014-04-22 ENCOUNTER — Other Ambulatory Visit: Payer: Self-pay

## 2014-07-01 LAB — HM MAMMOGRAPHY

## 2014-07-03 ENCOUNTER — Encounter: Payer: Self-pay | Admitting: Internal Medicine

## 2014-11-02 ENCOUNTER — Encounter: Payer: Self-pay | Admitting: Medical

## 2014-11-02 ENCOUNTER — Other Ambulatory Visit (HOSPITAL_COMMUNITY)
Admission: RE | Admit: 2014-11-02 | Discharge: 2014-11-02 | Disposition: A | Discharge status: Home / Self Care | Payer: 59 | Source: Ambulatory Visit | Attending: Medical | Admitting: Medical

## 2014-11-02 ENCOUNTER — Ambulatory Visit (INDEPENDENT_AMBULATORY_CARE_PROVIDER_SITE_OTHER): Payer: 59 | Admitting: Medical

## 2014-11-02 VITALS — BP 110/80 | HR 56 | Temp 98.1°F | Resp 16 | Ht 68.0 in | Wt 116.0 lb

## 2014-11-02 DIAGNOSIS — Z1211 Encounter for screening for malignant neoplasm of colon: Secondary | ICD-10-CM

## 2014-11-02 DIAGNOSIS — Z01419 Encounter for gynecological examination (general) (routine) without abnormal findings: Secondary | ICD-10-CM | POA: Diagnosis present

## 2014-11-02 DIAGNOSIS — Z124 Encounter for screening for malignant neoplasm of cervix: Secondary | ICD-10-CM

## 2014-11-02 DIAGNOSIS — Z0000 Encounter for general adult medical examination without abnormal findings: Secondary | ICD-10-CM

## 2014-11-02 DIAGNOSIS — Z7185 Encounter for immunization safety counseling: Secondary | ICD-10-CM

## 2014-11-02 DIAGNOSIS — Z23 Encounter for immunization: Secondary | ICD-10-CM

## 2014-11-02 DIAGNOSIS — Z7189 Other specified counseling: Secondary | ICD-10-CM

## 2014-11-02 DIAGNOSIS — M8580 Other specified disorders of bone density and structure, unspecified site: Secondary | ICD-10-CM

## 2014-11-02 LAB — COMPREHENSIVE METABOLIC PANEL
ALT: 16 U/L (ref 0–35)
AST: 22 U/L (ref 0–37)
Albumin: 4.4 g/dL (ref 3.5–5.2)
Alkaline Phosphatase: 62 U/L (ref 39–117)
BUN: 14 mg/dL (ref 6–23)
CO2: 28 mEq/L (ref 19–32)
Calcium: 9.3 mg/dL (ref 8.4–10.5)
Chloride: 102 mEq/L (ref 96–112)
Creat: 0.8 mg/dL (ref 0.50–1.10)
Glucose, Bld: 87 mg/dL (ref 70–99)
Potassium: 4.1 mEq/L (ref 3.5–5.3)
Sodium: 139 mEq/L (ref 135–145)
Total Bilirubin: 0.9 mg/dL (ref 0.2–1.2)
Total Protein: 6.7 g/dL (ref 6.0–8.3)

## 2014-11-02 LAB — LIPID PANEL
Cholesterol: 224 mg/dL — ABNORMAL HIGH (ref 0–200)
HDL: 82 mg/dL (ref >39)
LDL Cholesterol: 126 mg/dL — ABNORMAL HIGH (ref 0–99)
Total CHOL/HDL Ratio: 2.7 Ratio
Triglycerides: 78 mg/dL (ref <150)
VLDL: 16 mg/dL (ref 0–40)

## 2014-11-02 LAB — POCT URINALYSIS DIPSTICK
Bilirubin, UA: NEGATIVE
Blood, UA: NEGATIVE
Glucose, UA: NEGATIVE
Ketones, UA: NEGATIVE
Leukocytes, UA: NEGATIVE
Nitrite, UA: NEGATIVE
Protein, UA: NEGATIVE
Spec Grav, UA: 1.025
Urobilinogen, UA: NEGATIVE
pH, UA: 6

## 2014-11-02 NOTE — Patient Instructions (Signed)
Thank you for giving me the opportunity to serve you today.    Your diagnosis today includes: Encounter Diagnoses  Name Primary?  . Encounter for health maintenance examination in adult Yes  . Screening for cervical cancer   . Special screening for malignant neoplasms, colon   . Need for prophylactic vaccination and inoculation against influenza   . Vaccine counseling   . Osteopenia      Specific recommendations today include:  Check with Dr. Ulyses Amor office about when to repeat colonscopy.  You could be due as soon as 12/2014.  Check with the health dept about updated Typhoid vaccine  We updated your flu shot today  We will call with lab results  Return pending labs.   I have included other useful information below for your review.  Osteoporosis Throughout your life, your body breaks down old bone and replaces it with new bone. As you get older, your body does not replace bone as quickly as it breaks it down. By the age of 50 years, most people begin to gradually lose bone because of the imbalance between bone loss and replacement. Some people lose more bone than others. Bone loss beyond a specified normal degree is considered osteoporosis.  Osteoporosis affects the strength and durability of your bones. The inside of the ends of your bones and your flat bones, like the bones of your pelvis, look like honeycomb, filled with tiny open spaces. As bone loss occurs, your bones become less dense. This means that the open spaces inside your bones become bigger and the walls between these spaces become thinner. This makes your bones weaker. Bones of a person with osteoporosis can become so weak that they can break (fracture) during minor accidents, such as a simple fall. CAUSES  The following factors have been associated with the development of osteoporosis:  Smoking.  Drinking more than 2 alcoholic drinks several days per week.  Long-term use of certain  medicines:  Corticosteroids.  Chemotherapy medicines.  Thyroid medicines.  Antiepileptic medicines.  Gonadal hormone suppression medicine.  Immunosuppression medicine.  Being underweight.  Lack of physical activity.  Lack of exposure to the sun. This can lead to vitamin D deficiency.  Certain medical conditions:  Certain inflammatory bowel diseases, such as Crohn disease and ulcerative colitis.  Diabetes.  Hyperthyroidism.  Hyperparathyroidism. RISK FACTORS Anyone can develop osteoporosis. However, the following factors can increase your risk of developing osteoporosis:  Gender--Women are at higher risk than men.  Age--Being older than 50 years increases your risk.  Ethnicity--White and Asian people have an increased risk.  Weight --Being extremely underweight can increase your risk of osteoporosis.  Family history of osteoporosis--Having a family member who has developed osteoporosis can increase your risk. SYMPTOMS  Usually, people with osteoporosis have no symptoms.  DIAGNOSIS  Signs during a physical exam that may prompt your caregiver to suspect osteoporosis include:  Decreased height. This is usually caused by the compression of the bones that form your spine (vertebrae) because they have weakened and become fractured.  A curving or rounding of the upper back (kyphosis). To confirm signs of osteoporosis, your caregiver may request a procedure that uses 2 low-dose X-ray beams with different levels of energy to measure your bone mineral density (dual-energy X-ray absorptiometry [DXA]). Also, your caregiver may check your level of vitamin D. TREATMENT  The goal of osteoporosis treatment is to strengthen bones in order to decrease the risk of bone fractures. There are different types of medicines available to help  achieve this goal. Some of these medicines work by slowing the processes of bone loss. Some medicines work by increasing bone density. Treatment also  involves making sure that your levels of calcium and vitamin D are adequate. PREVENTION  There are things you can do to help prevent osteoporosis. Adequate intake of calcium and vitamin D can help you achieve optimal bone mineral density. Regular exercise can also help, especially resistance and weight-bearing activities. If you smoke, quitting smoking is an important part of osteoporosis prevention. MAKE SURE YOU:  Understand these instructions.  Will watch your condition.  Will get help right away if you are not doing well or get worse. FOR MORE INFORMATION www.osteo.org and EquipmentWeekly.com.ee Document Released: 08/02/2005 Document Revised: 02/17/2013 Document Reviewed: 10/07/2011 [facility] Patient Information 2015 [place], [place]. This information is not intended to replace advice given to you by your health care provider. Make sure you discuss any questions you have with your health care provider.

## 2014-11-02 NOTE — Progress Notes (Signed)
Subjective:   HPI  Kathryn Myers is a 53 y.o. female who presents for a complete physical.  Medical care team includes: [facility] Dr. Benson Norway, GI Dorothea Ogle PA here with Dr. Jill Alexanders   Preventative care: Last ophthalmology visit:YES LAST EYE EXAM 01/2014 Last dental visit:YES DR. POOLE Last colonoscopy:03/2014 Last mammogram:06/2014 Last gynecological exam:2013 Last EKG:10/2009 Last labs:2014  Prior vaccinations: TD or Tdap:2013 Influenza:11/02/2014  Concerns: Having some right shoulder pains.  Saw Dr. Redmond School back in May, had shot.  She notes hx/o hernia surgery, left inguinal region.   Lately some intermittent discomfort over thanksgiving, but resolved currently.  Reviewed their medical, surgical, family, social, medication, and allergy history and updated chart as appropriate.  Past Medical History  Diagnosis Date  . Allergic rhinitis   . Hemorrhoids   . Hx of abnormal cervical Pap smear   . Wears glasses   . Venereal disease contact     uses condoms, husband is HIV+  . H/O mammogram 07/01/14    normal  . Osteopenia 11/2012    repeat in 3-5 years    Past Surgical History  Procedure Laterality Date  . Inguinal hernia repair  2008    left  . Colonoscopy  12/2009    Dr. Benson Norway; sessile polyp, medium hemorrhoids, diverticulum    History   Social History  . Marital Status: Married    Spouse Name: N/A    Number of Children: N/A  . Years of Education: N/A   Occupational History  . procurement supervisor    Social History Main Topics  . Smoking status: Former Smoker -- 0.25 packs/day for 10 years    Quit date: 10/27/1991  . Smokeless tobacco: Not on file  . Alcohol Use: 4.2 oz/week    7 Glasses of wine per week  . Drug Use: No  . Sexual Activity: Not on file   Other Topics Concern  . 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  Travels to [place], [place], [place].      Family History  Problem Relation Age of Onset  . Hyperlipidemia Mother   . Osteoporosis Mother   . Parkinsonism Father   . Hyperlipidemia Father   . Hypertension Father   . Heart disease Father   . Cancer Brother     bone cancer as a child  . Cancer Paternal Uncle     pancreatic  . Heart disease Maternal Grandfather   . Stroke Neg Hx   . Diabetes Neg Hx   . Osteoporosis Maternal Grandmother     Current outpatient prescriptions: aspirin 81 MG tablet, Take 81 mg by mouth daily., Disp: , Rfl: ;  calcium-vitamin D (OSCAL WITH D) 500-200 MG-UNIT per tablet, Take 1 tablet by mouth., Disp: , Rfl: ;  Multiple Vitamin (MULTIVITAMIN) tablet, Take 1 tablet by mouth daily.  , Disp: , Rfl:  Current facility-administered medications: triamcinolone acetonide (KENALOG-40) injection 40 mg, 40 mg, Intramuscular, Once, Denita Lung, MD  No Known Allergies   Review of Systems Constitutional: -fever, -chills, -sweats, -unexpected weight change, -decreased appetite, -fatigue Allergy: -sneezing, -itching, -congestion Dermatology: -changing moles, --rash, -lumps ENT: -runny nose, -ear pain, -sore throat, -hoarseness, -sinus pain, -teeth pain, - ringing in ears, -hearing loss, -nosebleeds Cardiology: -chest pain, -palpitations, -swelling, -difficulty breathing when lying flat, -waking up short of  breath Respiratory: -cough, -shortness of breath, -difficulty breathing with exercise or exertion, -wheezing, -coughing up blood Gastroenterology: -abdominal pain, -nausea, -vomiting, -diarrhea, -constipation, -blood in stool, -changes in bowel movement, -difficulty swallowing or eating Hematology: -bleeding, -bruising  Musculoskeletal: +joint aches(RIGHT SHOULDER), -muscle aches, -joint swelling, -back pain, -neck pain, -cramping, -changes in gait Ophthalmology: denies vision changes, eye redness, itching,  discharge Urology: -burning with urination, -difficulty urinating, -blood in urine, -urinary frequency, -urgency, -incontinence Neurology: -headache, -weakness, -tingling, -numbness, -memory loss, -falls, -dizziness Psychology: -depressed mood, -agitation, -sleep problems     Objective:   Physical Exam  BP 110/80 mmHg  Pulse 56  Temp(Src) 98.1 F (36.7 C) (Oral)  Resp 16  Ht 5\' 8"  (1.727 m)  Wt 116 lb (52.617 kg)  BMI 17.64 kg/m2  General appearance: alert, no distress, WD/WN, lean white female Skin: scattered brown macules throughout, no particular worrisome lesions, surgical scars of right mid and lower left abdomen from prior dermatology biopsies HEENT: normocephalic, conjunctiva/corneas normal, sclerae anicteric, PERRLA, EOMi, nares patent, no discharge or erythema, pharynx normal Oral cavity: MMM, tongue normal, teeth in good repair Neck: supple, no lymphadenopathy, no thyromegaly, no masses, normal ROM, no bruits Chest: non tender, normal shape and expansion Heart: RRR, normal S1, S2, no murmurs Lungs: CTA bilaterally, no wheezes, rhonchi, or rales Abdomen: +bs, soft, LLQ surgical scar/inguinal scar, no obvious hernia or mass or tenderness, otherwise non tender, non distended, no masses, no hepatomegaly, no splenomegaly, no bruits Back: non tender, normal ROM, no scoliosis Musculoskeletal: mild pain with right shoulder abduction over 90 degrees, but otherwise nontender,normal ROM, otherwise extremities non tender, no obvious deformity, normal ROM throughout, lower extremities non tender, no obvious deformity, normal ROM throughout Extremities: no edema, no cyanosis, no clubbing Pulses: 2+ symmetric, upper and lower extremities, normal cap refill Neurological: alert, oriented x 3, CN2-12 intact, strength normal upper extremities and lower extremities, sensation normal throughout, DTRs 2+ throughout, no cerebellar signs, gait normal Psychiatric: normal affect, behavior normal,  pleasant  Breast: nontender, few scattered 1-57mm nodules left mid lower breast at 6 oclock and left upper breast at 1 oclock unchanged per patient, no skin changes, no nipple discharge or inversion, no axillary lymphadenopathy Gyn: Normal external genitalia without lesions, vagina with normal mucosa, cervix without lesions, no cervical motion tenderness, no abnormal vaginal discharge. Uterus and adnexa not enlarged, nontender, no masses. Pap performed. Exam chaperoned by nurse. Rectal: anus normal tone, occult negative stool, no worrisome lesions   Assessment and Plan :    Encounter Diagnoses  Name Primary?  . Encounter for health maintenance examination in adult Yes  . Screening for cervical cancer   . Special screening for malignant neoplasms, colon   . Need for prophylactic vaccination and inoculation against influenza   . Vaccine counseling   . Osteopenia     Physical exam - discussed healthy lifestyle, diet, exercise, preventative care, vaccinations, and addressed their concerns.  Handout given. We will call with lab results Counseled on the influenza virus vaccine.  Vaccine information sheet given.  Influenza vaccine given after consent obtained. We reviewed back over 11/2012 bone density scan.  C/t vit D/calcium daily, healthy diet, weight bearing exercise.  Check with Dr. Ulyses Amor office about when to repeat colonoscopy.  You could be due as soon as 12/2014. Check with the health dept about updated Typhoid vaccine Follow-up pending labs.

## 2014-11-03 LAB — CBC
HCT: 43.9 % (ref 36.0–46.0)
Hemoglobin: 14.5 g/dL (ref 12.0–15.0)
MCH: 31.9 pg (ref 26.0–34.0)
MCHC: 33 g/dL (ref 30.0–36.0)
MCV: 96.7 fL (ref 78.0–100.0)
MPV: 9.6 fL (ref 9.4–12.4)
Platelets: 293 10*3/uL (ref 150–400)
RBC: 4.54 MIL/uL (ref 3.87–5.11)
RDW: 13.3 % (ref 11.5–15.5)
WBC: 5.6 10*3/uL (ref 4.0–10.5)

## 2014-11-03 LAB — VITAMIN D 25 HYDROXY (VIT D DEFICIENCY, FRACTURES): Vit D, 25-Hydroxy: 41 ng/mL (ref 30–100)

## 2014-11-03 LAB — HIV ANTIBODY (ROUTINE TESTING W REFLEX): HIV 1&2 Ab, 4th Generation: NONREACTIVE

## 2014-11-04 LAB — CYTOLOGY - PAP

## 2015-07-19 ENCOUNTER — Encounter: Payer: Self-pay | Admitting: Medical

## 2015-07-21 LAB — HM MAMMOGRAPHY: HM Mammogram: NEGATIVE

## 2015-10-25 ENCOUNTER — Telehealth: Payer: Self-pay | Admitting: Medical

## 2015-10-25 ENCOUNTER — Ambulatory Visit (INDEPENDENT_AMBULATORY_CARE_PROVIDER_SITE_OTHER): Payer: 59 | Admitting: Medical

## 2015-10-25 ENCOUNTER — Encounter: Payer: Self-pay | Admitting: Medical

## 2015-10-25 VITALS — BP 132/90 | HR 65 | Ht 68.0 in | Wt 117.0 lb

## 2015-10-25 DIAGNOSIS — Z681 Body mass index (BMI) 19 or less, adult: Secondary | ICD-10-CM | POA: Diagnosis not present

## 2015-10-25 DIAGNOSIS — Z202 Contact with and (suspected) exposure to infections with a predominantly sexual mode of transmission: Secondary | ICD-10-CM

## 2015-10-25 DIAGNOSIS — R10814 Left lower quadrant abdominal tenderness: Secondary | ICD-10-CM

## 2015-10-25 DIAGNOSIS — M8580 Other specified disorders of bone density and structure, unspecified site: Secondary | ICD-10-CM

## 2015-10-25 DIAGNOSIS — J309 Allergic rhinitis, unspecified: Secondary | ICD-10-CM

## 2015-10-25 DIAGNOSIS — R609 Edema, unspecified: Secondary | ICD-10-CM

## 2015-10-25 DIAGNOSIS — Z0000 Encounter for general adult medical examination without abnormal findings: Secondary | ICD-10-CM | POA: Diagnosis not present

## 2015-10-25 DIAGNOSIS — R1032 Left lower quadrant pain: Secondary | ICD-10-CM

## 2015-10-25 DIAGNOSIS — Z23 Encounter for immunization: Secondary | ICD-10-CM | POA: Diagnosis not present

## 2015-10-25 LAB — COMPREHENSIVE METABOLIC PANEL
ALT: 16 U/L (ref 6–29)
AST: 23 U/L (ref 10–35)
Albumin: 4.2 g/dL (ref 3.6–5.1)
Alkaline Phosphatase: 54 U/L (ref 33–130)
BUN: 10 mg/dL (ref 7–25)
CO2: 28 mmol/L (ref 20–31)
Calcium: 9.2 mg/dL (ref 8.6–10.4)
Chloride: 100 mmol/L (ref 98–110)
Creat: 0.64 mg/dL (ref 0.50–1.05)
Glucose, Bld: 80 mg/dL (ref 65–99)
Potassium: 4 mmol/L (ref 3.5–5.3)
Sodium: 136 mmol/L (ref 135–146)
Total Bilirubin: 0.8 mg/dL (ref 0.2–1.2)
Total Protein: 7.1 g/dL (ref 6.1–8.1)

## 2015-10-25 LAB — POCT URINALYSIS DIPSTICK
Bilirubin, UA: NEGATIVE
Blood, UA: NEGATIVE
Glucose, UA: NEGATIVE
Leukocytes, UA: NEGATIVE
Nitrite, UA: NEGATIVE
Protein, UA: NEGATIVE
Spec Grav, UA: 1.02
Urobilinogen, UA: 0.2
pH, UA: 7

## 2015-10-25 LAB — LIPID PANEL
Cholesterol: 218 mg/dL — ABNORMAL HIGH (ref 125–200)
HDL: 91 mg/dL (ref >=46)
LDL Cholesterol: 113 mg/dL (ref <130)
Total CHOL/HDL Ratio: 2.4 Ratio (ref <=5.0)
Triglycerides: 69 mg/dL (ref <150)
VLDL: 14 mg/dL (ref <30)

## 2015-10-25 LAB — CBC
HCT: 38.4 % (ref 36.0–46.0)
Hemoglobin: 13.4 g/dL (ref 12.0–15.0)
MCH: 32.8 pg (ref 26.0–34.0)
MCHC: 34.9 g/dL (ref 30.0–36.0)
MCV: 93.9 fL (ref 78.0–100.0)
MPV: 9.6 fL (ref 8.6–12.4)
Platelets: 262 K/uL (ref 150–400)
RBC: 4.09 MIL/uL (ref 3.87–5.11)
RDW: 13.5 % (ref 11.5–15.5)
WBC: 6.8 K/uL (ref 4.0–10.5)

## 2015-10-25 NOTE — Telephone Encounter (Signed)
Please set up pelvic ultrasound for LLQ abdominal pain.  Please set up bone density screen for [facility].  Do both within the first 2 weeks of January.

## 2015-10-25 NOTE — Telephone Encounter (Signed)
Faxed over to [facility] for bone density. [place] is scheduled for Tuesday Jan 3rd 8:25 am. Nothing by mouth after midnight. Left detailed message about appt on pts voicemail.

## 2015-10-25 NOTE — Progress Notes (Signed)
Subjective:   HPI  Kathryn Myers is a 54 y.o. female who presents for a complete physical.  Chief Complaint  Patient presents with  . Annual Exam    fasting. urine obtained. sees eye doc yearly. sees [facility]. [facility]. no other doctors. will get flu shot today. thinks she may be getting a hernia?     Medical care team includes: [facility] twice [facility], just saw them recently Dr. Benson Norway, GI Dorothea Ogle PA here with Dr. Jill Alexanders  Concerns: Hernia concern - hx/o left inguinal repair but recently feels discomfort in LLQ, but no GI or GU or pelvic symptoms otherwise.      [facility] them recently.  Given her frequent travel, international travel, gets some lower leg swelling somewhat regularly.  [facility] told her to use compression hose.  Nothing surgical at this time.  Reviewed their medical, surgical, family, social, medication, and allergy history and updated chart as appropriate.  Past Medical History  Diagnosis Date  . Allergic rhinitis   . Hemorrhoids   . Hx of abnormal cervical Pap smear   . Wears glasses   . Venereal disease contact     uses condoms, husband is HIV+  . H/O mammogram 07/01/14    normal  . Osteopenia 11/2012    repeat in 3-5 years    Past Surgical History  Procedure Laterality Date  . Inguinal hernia repair  2008    left  . Colonoscopy  12/2009    Dr. Benson Norway; sessile polyp, medium hemorrhoids, diverticulum    Social History   Social History  . Marital Status: Married    Spouse Name: N/A  . Number of Children: N/A  . Years of Education: N/A   Occupational History  . procurement supervisor    Social History Main Topics  . Smoking status: Former Smoker -- 0.25 packs/day for 10 years    Quit date: 10/27/1991  . Smokeless tobacco: Not on file  . Alcohol Use: 4.2 oz/week    7 Glasses of wine per week  . Drug Use: No  . Sexual Activity: Not on file   Other Topics  Concern  . 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  Travels to [place], [place], [place].      Family History  Problem Relation Age of Onset  . Hyperlipidemia Mother   . Osteoporosis Mother   . Parkinsonism Father   . Hyperlipidemia Father   . Hypertension Father   . Heart disease Father   . Cancer Brother     bone cancer as a child  . Cancer Paternal Uncle     pancreatic  . Heart disease Maternal Grandfather   . Stroke Neg Hx   . Diabetes Neg Hx   . Osteoporosis Maternal Grandmother      Current outpatient prescriptions:  .  aspirin 81 MG tablet, Take 81 mg by mouth daily., Disp: , Rfl:  .  calcium-vitamin D (OSCAL WITH D) 500-200 MG-UNIT per tablet, Take 1 tablet by mouth., Disp: , Rfl:  .  Omega-3 Fatty Acids (FISH OIL PO), Take by mouth daily., Disp: , Rfl:  .  Multiple Vitamin (MULTIVITAMIN) tablet, Take 1 tablet by mouth daily.  , Disp: , Rfl:   Current facility-administered medications:  .  triamcinolone acetonide (KENALOG-40) injection 40 mg, 40 mg, Intramuscular, Once, Denita Lung, MD  No Known Allergies   Review of Systems Constitutional: -fever, -chills, -sweats, -unexpected weight change, -decreased appetite, -fatigue Allergy: -sneezing, -itching, -congestion Dermatology: -changing moles, --rash, -lumps ENT: -runny nose, -ear pain, -sore throat, -hoarseness, -sinus pain, -teeth pain, - ringing in ears, -hearing loss, -nosebleeds Cardiology: -chest pain, -palpitations, -swelling, -difficulty breathing when lying flat, -waking up short of breath Respiratory: -cough, -shortness of breath, -difficulty breathing with exercise or exertion, -wheezing, -coughing up blood Gastroenterology: -abdominal pain, -nausea, -vomiting, -diarrhea, -constipation, -blood in stool, -changes in bowel movement, -difficulty swallowing or  eating Hematology: -bleeding, -bruising  Musculoskeletal: -joint aches, -muscle aches, -joint swelling, -back pain, -neck pain, -cramping, -changes in gait Ophthalmology: denies vision changes, eye redness, itching, discharge Urology: -burning with urination, -difficulty urinating, -blood in urine, -urinary frequency, -urgency, -incontinence Neurology: -headache, -weakness, -tingling, -numbness, -memory loss, -falls, -dizziness Psychology: -depressed mood, -agitation, -sleep problems     Objective:   Physical Exam  BP 132/90 mmHg  Pulse 65  Ht 5\' 8"  (1.727 m)  Wt 117 lb (53.071 kg)  BMI 17.79 kg/m2  General appearance: alert, no distress, WD/WN, lean white female Skin: scattered brown macules throughout, no particular worrisome lesions, surgical scars of right mid and lower left abdomen and upper left back from prior dermatology biopsies HEENT: normocephalic, conjunctiva/corneas normal, sclerae anicteric, PERRLA, EOMi, nares patent, no discharge or erythema, pharynx normal Oral cavity: MMM, tongue normal, teeth in good repair Neck: supple, no lymphadenopathy, no thyromegaly, no masses, normal ROM, no bruits Chest: non tender, normal shape and expansion Heart: RRR, normal S1, S2, no murmurs Lungs: CTA bilaterally, no wheezes, rhonchi, or rales Abdomen: +bs, soft, LLQ surgical scar/inguinal scar, no obvious hernia or mass, mild LLQ tenderness, otherwise non tender, non distended, no masses, no hepatomegaly, no splenomegaly, no bruits Back: non tender, normal ROM, no scoliosis Musculoskeletal: mild pain with right shoulder abduction over 90 degrees, but otherwise nontender,normal ROM, otherwise extremities non tender, no obvious deformity, normal ROM throughout, lower extremities non tender, no obvious deformity, normal ROM throughout Extremities: no edema, no cyanosis, no clubbing Pulses: 2+ symmetric, upper and lower extremities, normal cap refill Neurological: alert, oriented x 3,  CN2-12 intact, strength normal upper extremities and lower extremities, sensation normal throughout, DTRs 2+ throughout, no cerebellar signs, gait normal Psychiatric: normal affect, behavior normal, pleasant  Breast: nontender,  no skin changes, no nipple discharge or inversion, no axillary lymphadenopathy Gyn: Normal external genitalia without lesions, vagina with normal mucosa, cervix with small round 45mm lesion of left cervix at 1 o'clock seems to be nabothian cyst, there is tenderness over left adnexa, otherwise no cervical motion tenderness, no abnormal vaginal discharge. Uterus and adnexa not enlarged, no masses. Exam chaperoned by nurse. Rectal: anus normal tone, occult negative stool, no worrisome lesions   Assessment and Plan :    Encounter Diagnoses  Name Primary?  . Encounter for health maintenance examination in adult Yes  . Osteopenia   . Allergic rhinitis, unspecified allergic rhinitis type   . Venereal disease contact   . Edema, unspecified type   . Need for prophylactic vaccination and inoculation against influenza   . BMI less than 19,adult   . LLQ abdominal tenderness     Physical exam - discussed healthy lifestyle, diet, exercise, preventative care, vaccinations, and addressed their concerns.  We will call with lab results Counseled on the influenza virus vaccine.  Vaccine information sheet given.  Influenza vaccine  given after consent obtained. Osteopenia -  We reviewed back over 11/2012 bone density scan.  C/t vit D/calcium daily, healthy diet, weight bearing exercise.  Set up for repeat bone density scan. HIV test routine, husband has HIV but they use condoms. Counseled on the influenza virus vaccine.  Vaccine information sheet given.  Influenza vaccine given after consent obtained. Edema - c/t leg elevation, compression hose and can consider low dose HCTZ if needed.   She will let me know. LLQ tenderness - will eval with pelvic ultrasound Follow-up pending  labs.

## 2015-10-26 LAB — VITAMIN D 25 HYDROXY (VIT D DEFICIENCY, FRACTURES): Vit D, 25-Hydroxy: 38 ng/mL (ref 30–100)

## 2015-10-26 LAB — HIV ANTIBODY (ROUTINE TESTING W REFLEX): HIV 1&2 Ab, 4th Generation: NONREACTIVE

## 2015-11-04 ENCOUNTER — Telehealth: Payer: Self-pay

## 2015-11-04 NOTE — Telephone Encounter (Signed)
Pt called to ask about her [facility] appt i gave her the number since they have not called her. Reminded her of her u/s appt

## 2015-11-04 NOTE — Telephone Encounter (Signed)
Pt  Called because [facility] said they never received the fax so i sent it to the [address] address again and the [phone] number. Pt has appt for 11/14/14

## 2015-11-09 ENCOUNTER — Other Ambulatory Visit: Payer: Self-pay

## 2015-11-12 ENCOUNTER — Telehealth: Payer: Self-pay

## 2015-11-12 ENCOUNTER — Ambulatory Visit: Admission: RE | Admit: 2015-11-12 | Payer: 59 | Source: Ambulatory Visit

## 2015-11-12 DIAGNOSIS — R1032 Left lower quadrant pain: Secondary | ICD-10-CM

## 2015-11-12 NOTE — Telephone Encounter (Signed)
Looks like I scheduled her for an abdomen ultrasound instead of a pelvic, I am not sure how that happened. I even noted scheduling for a pelvic in the result note. I voiced my apologies to the pt. They are going to schedule her for the appt but she wont be able to have this done today.

## 2015-11-15 LAB — HM DEXA SCAN

## 2015-11-16 ENCOUNTER — Other Ambulatory Visit: Payer: Self-pay | Admitting: Medical

## 2015-11-16 DIAGNOSIS — R1032 Left lower quadrant pain: Secondary | ICD-10-CM

## 2015-11-17 ENCOUNTER — Ambulatory Visit
Admission: RE | Admit: 2015-11-17 | Discharge: 2015-11-17 | Disposition: A | Discharge status: Home / Self Care | Payer: 59 | Source: Ambulatory Visit | Attending: Medical | Admitting: Medical

## 2015-11-17 DIAGNOSIS — R1032 Left lower quadrant pain: Secondary | ICD-10-CM

## 2015-11-18 ENCOUNTER — Encounter: Payer: Self-pay | Admitting: Medical

## 2015-11-18 ENCOUNTER — Telehealth: Payer: Self-pay | Admitting: Medical

## 2015-11-18 NOTE — Telephone Encounter (Signed)
LMTCB

## 2015-11-18 NOTE — Telephone Encounter (Signed)
Her bone density scan shows osteopenia or low bone mass, but not quite osteoporosis.   Based on current guidelines, the recommendations including getting 1200mg  of Calcium in diet daily, at least 1000 IU Vit D daily, regular weight bearing exercise, limiting alcohol, and avoiding falls.   I recommend repeat study in 3-4 years

## 2015-11-19 NOTE — Telephone Encounter (Signed)
LMTCB

## 2015-11-24 NOTE — Telephone Encounter (Signed)
Sent letter

## 2016-05-10 ENCOUNTER — Ambulatory Visit (INDEPENDENT_AMBULATORY_CARE_PROVIDER_SITE_OTHER): Payer: 59 | Admitting: Medical

## 2016-05-10 ENCOUNTER — Encounter: Payer: Self-pay | Admitting: Medical

## 2016-05-10 VITALS — BP 102/70 | HR 56 | Wt 117.0 lb

## 2016-05-10 DIAGNOSIS — S20212A Contusion of left front wall of thorax, initial encounter: Secondary | ICD-10-CM

## 2016-05-10 NOTE — Progress Notes (Signed)
Subjective: Chief Complaint  Patient presents with  . cut lt side on fence    was coming into fence and hit her side on the latch when grabbing her dog. not bruised much but is hurting her to move now.    Was working out in the yard yesterday, opened the gate to the fence, her dog jumped up to see her excited, and as she tried to keep dog from coming out of the fence, jammed the gate closing hardware into left chest.  Has small bruise, lots of tenderness to the left chest wall.  No trouble breathing, no hemoptysis, no fever, no NVD.   Is traveling on international trip for work Sunday, wanted to get this checked out. Denies laceration or puncture wound.  Hurts to take deep breath, cough, laugh.  No other aggravating or relieving factors. No other complaint.   Past Medical History  Diagnosis Date  . Allergic rhinitis   . Hemorrhoids   . Hx of abnormal cervical Pap smear   . Wears glasses   . Venereal disease contact     uses condoms, husband is HIV+  . H/O mammogram 07/01/14    normal  . Osteopenia 11/2012    repeat in 3-5 years   ROS as in subjective    Objective: BP 102/70 mmHg  Pulse 56  Wt 117 lb (53.071 kg)  Gen; wd, wn, nad Left lower chest wall with 2 cm diameter purplish ecchymosis, tender in same area, but otherwise nontender, normal I:E.    Lungs clear, no wheezing, no rhonchi or rales.   abdomen and back nontender.     Assessment: Encounter Diagnosis  Name Primary?  . Rib contusion, left, initial encounter Yes     Plan:  discussed her symptoms, concerns, mechanism of injury.   Advised ice topically 45min at a time, compression dressing such as underarmour compression shirt vs rib belt OTC vs ACE wrap.  advised it may take 2-4 wk to resolve.  She can use OTC analgesic as discussed.  She declines prescription pain medication.

## 2016-06-02 ENCOUNTER — Encounter: Payer: Self-pay | Admitting: Medical

## 2016-07-24 LAB — HM MAMMOGRAPHY

## 2016-07-25 ENCOUNTER — Encounter: Payer: Self-pay | Admitting: Medical

## 2016-07-26 ENCOUNTER — Telehealth: Payer: Self-pay | Admitting: Medical

## 2016-07-26 NOTE — Telephone Encounter (Signed)
Mammogram normal/no worrisome findings. 

## 2016-07-31 NOTE — Telephone Encounter (Signed)
Left word for word message  

## 2016-11-01 ENCOUNTER — Encounter: Payer: 59 | Admitting: Medical

## 2016-11-09 DIAGNOSIS — J00 Acute nasopharyngitis [common cold]: Secondary | ICD-10-CM | POA: Diagnosis not present

## 2016-11-13 ENCOUNTER — Encounter: Payer: 59 | Admitting: Medical

## 2017-05-01 DIAGNOSIS — L820 Inflamed seborrheic keratosis: Secondary | ICD-10-CM | POA: Diagnosis not present

## 2017-05-01 DIAGNOSIS — L814 Other melanin hyperpigmentation: Secondary | ICD-10-CM | POA: Diagnosis not present

## 2017-05-01 DIAGNOSIS — D225 Melanocytic nevi of trunk: Secondary | ICD-10-CM | POA: Diagnosis not present

## 2017-05-01 DIAGNOSIS — Z85820 Personal history of malignant melanoma of skin: Secondary | ICD-10-CM | POA: Diagnosis not present

## 2017-06-23 DIAGNOSIS — S80862A Insect bite (nonvenomous), left lower leg, initial encounter: Secondary | ICD-10-CM | POA: Diagnosis not present

## 2017-07-19 DIAGNOSIS — R079 Chest pain, unspecified: Secondary | ICD-10-CM | POA: Diagnosis not present

## 2017-07-19 DIAGNOSIS — Z8249 Family history of ischemic heart disease and other diseases of the circulatory system: Secondary | ICD-10-CM | POA: Diagnosis not present

## 2017-07-19 DIAGNOSIS — Z23 Encounter for immunization: Secondary | ICD-10-CM | POA: Diagnosis not present

## 2017-07-30 DIAGNOSIS — Z1231 Encounter for screening mammogram for malignant neoplasm of breast: Secondary | ICD-10-CM | POA: Diagnosis not present

## 2017-07-30 LAB — HM MAMMOGRAPHY

## 2017-08-02 ENCOUNTER — Telehealth: Payer: Self-pay | Admitting: Medical

## 2017-08-02 NOTE — Telephone Encounter (Signed)
I am happy to report that her mammogram was normal, no worrisome findings.  Get her in for physical, its been a while

## 2017-08-02 NOTE — Telephone Encounter (Signed)
Pt was notified results and made an appt for physical

## 2017-09-04 DIAGNOSIS — Z23 Encounter for immunization: Secondary | ICD-10-CM | POA: Diagnosis not present

## 2017-09-24 ENCOUNTER — Encounter: Payer: Self-pay | Admitting: Medical

## 2017-09-24 ENCOUNTER — Ambulatory Visit: Payer: 59 | Admitting: Medical

## 2017-09-24 ENCOUNTER — Other Ambulatory Visit (HOSPITAL_COMMUNITY)
Admission: RE | Admit: 2017-09-24 | Discharge: 2017-09-24 | Disposition: A | Discharge status: Home / Self Care | Payer: 59 | Source: Ambulatory Visit | Attending: Family Medicine | Admitting: Family Medicine

## 2017-09-24 VITALS — BP 122/84 | HR 70 | Ht 67.0 in | Wt 107.6 lb

## 2017-09-24 DIAGNOSIS — K4090 Unilateral inguinal hernia, without obstruction or gangrene, not specified as recurrent: Secondary | ICD-10-CM | POA: Insufficient documentation

## 2017-09-24 DIAGNOSIS — R634 Abnormal weight loss: Secondary | ICD-10-CM | POA: Diagnosis not present

## 2017-09-24 DIAGNOSIS — Z7189 Other specified counseling: Secondary | ICD-10-CM

## 2017-09-24 DIAGNOSIS — Z7185 Encounter for immunization safety counseling: Secondary | ICD-10-CM | POA: Insufficient documentation

## 2017-09-24 DIAGNOSIS — Z1159 Encounter for screening for other viral diseases: Secondary | ICD-10-CM

## 2017-09-24 DIAGNOSIS — Z113 Encounter for screening for infections with a predominantly sexual mode of transmission: Secondary | ICD-10-CM | POA: Insufficient documentation

## 2017-09-24 DIAGNOSIS — Z0000 Encounter for general adult medical examination without abnormal findings: Secondary | ICD-10-CM | POA: Diagnosis not present

## 2017-09-24 DIAGNOSIS — Z124 Encounter for screening for malignant neoplasm of cervix: Secondary | ICD-10-CM | POA: Diagnosis not present

## 2017-09-24 DIAGNOSIS — N841 Polyp of cervix uteri: Secondary | ICD-10-CM | POA: Diagnosis not present

## 2017-09-24 LAB — POCT URINALYSIS DIP (PROADVANTAGE DEVICE)
Bilirubin, UA: NEGATIVE
Blood, UA: NEGATIVE
Glucose, UA: NEGATIVE mg/dL
Ketones, POC UA: NEGATIVE mg/dL
Leukocytes, UA: NEGATIVE
Nitrite, UA: NEGATIVE
Protein Ur, POC: NEGATIVE mg/dL
Specific Gravity, Urine: 1.02
Urobilinogen, Ur: NEGATIVE
pH, UA: 6 (ref 5.0–8.0)

## 2017-09-24 NOTE — Patient Instructions (Addendum)
Recommendations:  Get mammogram every 1-2 years  We will call with lab results and possible imaging given the weight loss  I recommend you have a shingles vaccine to help prevent shingles or herpes zoster outbreak.   Please call your insurer to inquire about coverage for the Shingrix vaccine given in 2 doses.   Some insurers cover this vaccine after age 58, some cover this after age 72.  If your insurer covers this, then call to schedule appointment to have this vaccine here.  Make sure you are taking 1000 IU vitamin D daily over the counter  Get at least 1200mg  of calcium in diet either thorugh dairy or supplmenet daily  We will be referring you to gynecology given the cerivcal polyp    Preventing Osteoporosis, Adult Osteoporosis is a condition that causes the bones to get weaker. With osteoporosis, the bones become thinner, and the normal spaces in bone tissue become larger. This can make the bones weak and cause them to break more easily. People who have osteoporosis are more likely to break their wrist, spine, or hip. Even a minor accident or injury can be enough to break weak bones. Osteoporosis can occur with aging. Your body constantly replaces old bone tissue with new tissue. As you get older, you may lose bone tissue more quickly, or it may be replaced more slowly. Osteoporosis is more likely to develop if you have poor nutrition or do not get enough calcium or vitamin D. Other lifestyle factors can also play a role. By making some diet and lifestyle changes, you can help to keep your bones healthy and help to prevent osteoporosis. What nutrition changes can be made? Nutrition plays an important role in maintaining healthy, strong bones.  Make sure you get enough calcium every day from food or from calcium supplements. ? If you are age 71 or younger, aim to get 1,000 mg of calcium every day. ? If you are older than age 24, aim to get 1,200 mg of calcium every day.  Try to get  enough vitamin D every day. ? If you are age 59 or younger, aim to get 600 international units (IU) every day. ? If you are older than age 52, aim to get 800 international units every day.  Follow a healthy diet. Eat plenty of foods that contain calcium and vitamin D. ? Calcium is in milk, cheese, yogurt, and other dairy products. Some fish and vegetables are also good sources of calcium. Many foods such as cereals and breads have had calcium added to them (are fortified). Check nutrition labels to see how much calcium is in a food or drink. ? Foods that contain vitamin D include milk, cereals, salmon, and tuna. Your body also makes vitamin D when you are out in the sun. Bare skin exposure to the sun on your face, arms, legs, or back for no more than 30 minutes a day, 2 times per week is more than enough. Beyond that, it is important to use sunblock to protect your skin from sunburn, which increases your risk for skin cancer.  What lifestyle changes can be made? Making changes in your everyday life can also play an important role in preventing osteoporosis.  Stay active and get exercise every day. Ask your health care provider what types of exercise are best for you.  Do not use any products that contain nicotine or tobacco, such as cigarettes and e-cigarettes. If you need help quitting, ask your health care provider.  Limit  alcohol intake to no more than 1 drink a day for nonpregnant women and 2 drinks a day for men. One drink equals 12 oz of beer, 5 oz of wine, or 1 oz of hard liquor.  Why are these changes important? Making these nutrition and lifestyle changes can:  Help you develop and maintain healthy, strong bones.  Prevent loss of bone mass and the problems that are caused by that loss, such as broken bones and delayed healing.  Make you feel better mentally and physically.  What can happen if changes are not made? Problems that can result from osteoporosis can be very serious.  These may include:  A higher risk of broken bones that are painful and do not heal well.  Physical malformations, such as a collapsed spine or a hunched back.  Problems with movement.  Where to find support: If you need help making changes to prevent osteoporosis, talk with your health care provider. You can ask for a referral to a diet and nutrition specialist (dietitian) and a physical therapist. Where to find more information: Learn more about osteoporosis from:  NIH Osteoporosis and Related [place]: www.niams.GolfingGoddess.com.br  U.S. Office on Women's Health: SouvenirBaseball.es.html  National Osteoporosis Foundation: ProfilePeek.ch  Summary  Osteoporosis is a condition that causes weak bones that are more likely to break.  Eating a healthy diet and making sure you get enough calcium and vitamin D can help prevent osteoporosis.  Other ways to reduce your risk of osteoporosis include getting regular exercise and avoiding alcohol and products that contain nicotine or tobacco. This information is not intended to replace advice given to you by your health care provider. Make sure you discuss any questions you have with your health care provider. Document Released: 11/07/2015 Document Revised: 07/03/2016 Document Reviewed: 07/03/2016 Elsevier Interactive Patient Education  [employer].

## 2017-09-24 NOTE — Progress Notes (Signed)
Subjective:   HPI  Kathryn Myers is a 56 y.o. female who presents for Chief Complaint  Patient presents with  . Annual Exam    physical,possible hernia right side     Medical care team includes: Dontrez Pettis, Camelia Eng, PA-C here for primary care Dentist Eye doctor [facility] dermatology  Concerns: She notes since 06/2016 visit at urgent care for bee sting, there has been some unexplained weight loss.   She denies any particular symptoms other than hernia right inguinal area.  She has hx/o left inguinal hernia.   She has cut out more carbs and working more hours this past several months.  Cut out carbs to help husband lose weight.    Reviewed their medical, surgical, family, social, medication, and allergy history and updated chart as appropriate.  Past Medical History:  Diagnosis Date  . Allergic rhinitis   . Hemorrhoids   . Hx of abnormal cervical Pap smear   . Osteopenia 11/2012   repeat in 3-5 years  . Venereal disease contact    uses condoms, husband is HIV+  . Wears glasses     Past Surgical History:  Procedure Laterality Date  . COLONOSCOPY  12/2009   Dr. Benson [place]; sessile polyp, medium hemorrhoids, diverticulum  . INGUINAL HERNIA REPAIR  2008   left    Social History   Socioeconomic History  . Marital status: Married    Spouse name: Not on file  . Number of children: Not on file  . Years of education: Not on file  . Highest education level: Not on file  Social Needs  . Financial resource strain: Not on file  . Food insecurity - worry: Not on file  . Food insecurity - inability: Not on file  . Transportation needs - medical: Not on file  . Transportation needs - non-medical: Not on file  Occupational History  . Occupation: Radio broadcast assistant: [employer]  Tobacco Use  . Smoking status: Former Smoker    Packs/day: 0.25    Years: 10.00    Pack years: 2.50    Last attempt to quit: 10/27/1991    Years since quitting: 25.9  . Smokeless tobacco:  Never Used  Substance and Sexual Activity  . Alcohol use: Yes    Alcohol/week: 4.2 oz    Types: 7 Glasses of wine per week  . Drug use: No  . Sexual activity: Not on file  Other Topics Concern  . 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  Travels to [place], [place], [place].      Family History  Problem Relation Age of Onset  . Hyperlipidemia Mother   . Osteoporosis Mother   . Parkinsonism Father   . Hyperlipidemia Father   . Hypertension Father   . Heart disease Father   . Cancer Brother        bone cancer as a child  . Cancer Paternal Uncle        pancreatic  . Heart disease Maternal Grandfather   . Osteoporosis Maternal Grandmother   . Stroke Neg Hx   . Diabetes Neg Hx      Current Outpatient Medications:  .  aspirin 81 MG tablet, Take 81 mg by mouth daily., Disp: , Rfl:  .  calcium-vitamin D (OSCAL WITH D) 500-200 MG-UNIT per tablet, Take 1 tablet by mouth., Disp: ,  Rfl:  .  Multiple Vitamin (MULTIVITAMIN) tablet, Take 1 tablet by mouth daily.  , Disp: , Rfl:  .  Omega-3 Fatty Acids (FISH OIL PO), Take by mouth daily., Disp: , Rfl:   Current Facility-Administered Medications:  .  triamcinolone acetonide (KENALOG-40) injection 40 mg, 40 mg, Intramuscular, Once, Denita Lung, MD  No Known Allergies   Review of Systems Constitutional: -fever, -chills, -sweats, +unexpected weight change, -decreased appetite, -fatigue Allergy: -sneezing, -itching, -congestion Dermatology: -changing moles, --rash, -lumps ENT: -runny nose, -ear pain, -sore throat, -hoarseness, -sinus pain, -teeth pain, - ringing in ears, -hearing loss, -nosebleeds Cardiology: -chest pain, -palpitations, -swelling, -difficulty breathing when lying flat, -waking up short of breath Respiratory: -cough, -shortness of breath, -difficulty breathing with exercise  or exertion, -wheezing, -coughing up blood Gastroenterology: -abdominal pain, -nausea, -vomiting, -diarrhea, -constipation, -blood in stool, -changes in bowel movement, -difficulty swallowing or eating Hematology: -bleeding, -bruising  Musculoskeletal: -joint aches, -muscle aches, -joint swelling, -back pain, -neck pain, -cramping, -changes in gait Ophthalmology: denies vision changes, eye redness, itching, discharge Urology: -burning with urination, -difficulty urinating, -blood in urine, -urinary frequency, -urgency, -incontinence Neurology: -headache, -weakness, -tingling, -numbness, -memory loss, -falls, -dizziness Psychology: -depressed mood, -agitation, -sleep problems Breast/gyn: -breast tenderness, -discharge, -lumps, -vaginal discharge,- irregular periods, -heavy periods    Objective:   BP 122/84   Pulse 70   Ht 5\' 7"  (1.702 m)   Wt 107 lb 9.6 oz (48.8 kg)   SpO2 96%   BMI 16.85 kg/m   Wt Readings from Last 3 Encounters:  09/24/17 107 lb 9.6 oz (48.8 kg)  05/10/16 117 lb (53.1 kg)  10/25/15 117 lb (53.1 kg)    General appearance: alert, no distress, WD/WN, Caucasian female, underweight Skin: scattered macules, no particular worrisome lesions HEENT: normocephalic, conjunctiva/corneas normal, sclerae anicteric, PERRLA, EOMi, nares patent, no discharge or erythema, pharynx normal Oral cavity: MMM, tongue normal, teeth in good repair Neck: supple, no lymphadenopathy, no thyromegaly, no masses, normal ROM, no bruits Chest: non tender, upper chest medially around rib 3 bilat with bony prominence bilat, worse left, but chronic per patient, otherwise normal shape and expansion Heart: RRR, normal S1, S2, no murmurs Lungs: CTA bilaterally, no wheezes, rhonchi, or rales Abdomen: +bs, soft, non tender, non distended, no masses, no hepatomegaly, no splenomegaly, small bulge right inguinal region suggestive of possible hernia, no bruits Back: non tender, normal ROM, no  scoliosis Musculoskeletal: upper extremities non tender, no obvious deformity, normal ROM throughout, lower extremities non tender, no obvious deformity, normal ROM throughout Extremities: no edema, no cyanosis, no clubbing Pulses: 2+ symmetric, upper and lower extremities, normal cap refill Neurological: alert, oriented x 3, CN2-12 intact, strength normal upper extremities and lower extremities, sensation normal throughout, DTRs 2+ throughout, no cerebellar signs, gait normal Psychiatric: normal affect, behavior normal, pleasant  Breast: nontender, no masses or lumps, no skin changes, no nipple discharge or inversion, no axillary lymphadenopathy Gyn: Normal external genitalia without lesions, vagina with normal mucosa, cervix with 3-5mm diameter polyp appearing lesions at os, no cervical motion tenderness, no abnormal vaginal discharge.  Uterus and adnexa not enlarged, nontender, no masses.  Pap performed.  Exam chaperoned by nurse. Rectal: anus normal tone, occult negative stool  Assessment and Plan :    Encounter Diagnoses  Name Primary?  . Routine general medical examination at a health care facility Yes  . Weight loss   . Screening for cervical cancer   . Vaccine counseling     Physical exam - discussed and  counseled on healthy lifestyle, diet, exercise, preventative care, vaccinations, sick and well care, proper use of emergency dept and after hours care, and addressed their concerns.    Health screening: Bone density - osteopenia on 11/2015 bone density, discussed vit D, calcium intake, weight bearing exercise See your eye doctor yearly for routine vision care. See your dentist yearly for routine dental care including hygiene visits twice yearly.  Cancer screening Discussed and advised monthly self breast exams Discussed mammogram, advised mammogram yearly Discussed pap smear recommendations.   Pap smear today Discussed colonoscopy screening, FOBT  today.  Vaccinations: Counseled on the following vaccines:  I recommend you have a shingles vaccine to help prevent shingles or herpes zoster outbreak.   Please call your insurer to inquire about coverage for the Shingrix vaccine given in 2 doses.   Some insurers cover this vaccine after age 71, some cover this after age 75.  If your insurer covers this, then call to schedule appointment to have this vaccine here.  Unexplained weight loss - labs today, consider CXR, CT abd/pelvis.  Await labs  Right inguinal hernia - small, will put this on back burner until we resolve the weight loss issue  Cervical polyp - await pap and will refer to gyn  Laquida was seen today for annual exam.  Diagnoses and all orders for this visit:  Routine general medical examination at a health care facility -     POCT Urinalysis DIP (Proadvantage Device) -     CBC with Differential/Platelet -     Comprehensive metabolic panel -     Lipid panel -     TSH -     T4, free -     HIV antibody -     Cytology - PAP  Weight loss -     CBC with Differential/Platelet -     Comprehensive metabolic panel -     TSH -     T4, free  Screening for cervical cancer -     Cytology - PAP  Vaccine counseling   Follow-up pending labs, yearly for physical

## 2017-09-25 LAB — CYTOLOGY - PAP: Diagnosis: NEGATIVE

## 2017-09-25 LAB — HIV ANTIBODY (ROUTINE TESTING W REFLEX): HIV 1&2 Ab, 4th Generation: NONREACTIVE

## 2017-09-25 LAB — COMPREHENSIVE METABOLIC PANEL
AG Ratio: 1.6 (calc) (ref 1.0–2.5)
ALT: 20 U/L (ref 6–29)
AST: 27 U/L (ref 10–35)
Albumin: 4.5 g/dL (ref 3.6–5.1)
Alkaline phosphatase (APISO): 59 U/L (ref 33–130)
BUN: 14 mg/dL (ref 7–25)
CO2: 26 mmol/L (ref 20–32)
Calcium: 9.6 mg/dL (ref 8.6–10.4)
Chloride: 102 mmol/L (ref 98–110)
Creat: 0.85 mg/dL (ref 0.50–1.05)
Globulin: 2.8 g/dL (calc) (ref 1.9–3.7)
Glucose, Bld: 103 mg/dL — ABNORMAL HIGH (ref 65–99)
Potassium: 4.3 mmol/L (ref 3.5–5.3)
Sodium: 139 mmol/L (ref 135–146)
Total Bilirubin: 0.8 mg/dL (ref 0.2–1.2)
Total Protein: 7.3 g/dL (ref 6.1–8.1)

## 2017-09-25 LAB — CBC WITH DIFFERENTIAL/PLATELET
Basophils Absolute: 73 cells/uL (ref 0–200)
Basophils Relative: 1.4 %
Eosinophils Absolute: 31 cells/uL (ref 15–500)
Eosinophils Relative: 0.6 %
HCT: 41.2 % (ref 35.0–45.0)
Hemoglobin: 14.1 g/dL (ref 11.7–15.5)
Lymphs Abs: 1893 cells/uL (ref 850–3900)
MCH: 32.5 pg (ref 27.0–33.0)
MCHC: 34.2 g/dL (ref 32.0–36.0)
MCV: 94.9 fL (ref 80.0–100.0)
MPV: 11 fL (ref 7.5–12.5)
Monocytes Relative: 6 %
Neutro Abs: 2891 cells/uL (ref 1500–7800)
Neutrophils Relative %: 55.6 %
Platelets: 275 10*3/uL (ref 140–400)
RBC: 4.34 10*6/uL (ref 3.80–5.10)
RDW: 12.1 % (ref 11.0–15.0)
Total Lymphocyte: 36.4 %
WBC mixed population: 312 cells/uL (ref 200–950)
WBC: 5.2 10*3/uL (ref 3.8–10.8)

## 2017-09-25 LAB — HEPATITIS C ANTIBODY
Hepatitis C Ab: NONREACTIVE
SIGNAL TO CUT-OFF: 0.01 (ref <1.00)

## 2017-09-25 LAB — T4, FREE: Free T4: 1.3 ng/dL (ref 0.8–1.8)

## 2017-09-25 LAB — LIPID PANEL
Cholesterol: 216 mg/dL — ABNORMAL HIGH (ref <200)
HDL: 92 mg/dL (ref >50)
LDL Cholesterol (Calc): 109 mg/dL (calc) — ABNORMAL HIGH
Non-HDL Cholesterol (Calc): 124 mg/dL (calc) (ref <130)
Total CHOL/HDL Ratio: 2.3 (calc) (ref <5.0)
Triglycerides: 60 mg/dL (ref <150)

## 2017-09-25 LAB — TSH: TSH: 2.56 mIU/L (ref 0.40–4.50)

## 2017-09-26 ENCOUNTER — Other Ambulatory Visit: Payer: Self-pay | Admitting: Medical

## 2017-09-26 DIAGNOSIS — R634 Abnormal weight loss: Secondary | ICD-10-CM

## 2017-10-05 ENCOUNTER — Telehealth: Payer: Self-pay

## 2017-10-05 NOTE — Telephone Encounter (Signed)
[facility] call about order for ct scan and wants know if her ct scan can be with contrast, is this okay can change the order in the computer ?

## 2017-10-05 NOTE — Telephone Encounter (Signed)
[facility] call about order for ct scan and wants know if her ct scan can be with contrast, is this okay can change the order in the computer ?

## 2017-10-08 NOTE — Telephone Encounter (Signed)
[facility] for contrast

## 2017-10-09 ENCOUNTER — Other Ambulatory Visit: Payer: Self-pay | Admitting: Medical

## 2017-10-09 DIAGNOSIS — R634 Abnormal weight loss: Secondary | ICD-10-CM

## 2017-10-09 NOTE — Telephone Encounter (Signed)
Order in.

## 2017-10-09 NOTE — Telephone Encounter (Signed)
Can you put the order in the computer

## 2017-10-10 NOTE — Telephone Encounter (Signed)
Called and spoke  With [facility]. Pt will call and set up

## 2017-10-19 ENCOUNTER — Ambulatory Visit
Admission: RE | Admit: 2017-10-19 | Discharge: 2017-10-19 | Disposition: A | Discharge status: Home / Self Care | Payer: 59 | Source: Ambulatory Visit | Attending: Medical | Admitting: Medical

## 2017-10-19 ENCOUNTER — Other Ambulatory Visit: Payer: 59

## 2017-10-19 DIAGNOSIS — R634 Abnormal weight loss: Secondary | ICD-10-CM

## 2017-10-19 DIAGNOSIS — K7689 Other specified diseases of liver: Secondary | ICD-10-CM | POA: Diagnosis not present

## 2017-10-19 MED ORDER — IOPAMIDOL (ISOVUE-300) INJECTION 61%
100.0000 mL | Freq: Once | INTRAVENOUS | Status: AC | PRN
  Administered 2017-10-19: 100 mL via INTRAVENOUS

## 2017-10-24 DIAGNOSIS — L814 Other melanin hyperpigmentation: Secondary | ICD-10-CM | POA: Diagnosis not present

## 2017-10-24 DIAGNOSIS — D1801 Hemangioma of skin and subcutaneous tissue: Secondary | ICD-10-CM | POA: Diagnosis not present

## 2017-10-24 DIAGNOSIS — D225 Melanocytic nevi of trunk: Secondary | ICD-10-CM | POA: Diagnosis not present

## 2018-01-17 DIAGNOSIS — I10 Essential (primary) hypertension: Secondary | ICD-10-CM | POA: Diagnosis not present

## 2018-01-17 DIAGNOSIS — E039 Hypothyroidism, unspecified: Secondary | ICD-10-CM | POA: Diagnosis not present

## 2018-05-06 DIAGNOSIS — D1801 Hemangioma of skin and subcutaneous tissue: Secondary | ICD-10-CM | POA: Diagnosis not present

## 2018-05-06 DIAGNOSIS — L814 Other melanin hyperpigmentation: Secondary | ICD-10-CM | POA: Diagnosis not present

## 2018-05-06 DIAGNOSIS — L570 Actinic keratosis: Secondary | ICD-10-CM | POA: Diagnosis not present

## 2018-05-06 DIAGNOSIS — D225 Melanocytic nevi of trunk: Secondary | ICD-10-CM | POA: Diagnosis not present

## 2018-07-17 LAB — HM DEXA SCAN

## 2018-08-01 DIAGNOSIS — Z1231 Encounter for screening mammogram for malignant neoplasm of breast: Secondary | ICD-10-CM | POA: Diagnosis not present

## 2018-08-01 LAB — HM MAMMOGRAPHY

## 2018-08-05 ENCOUNTER — Telehealth: Payer: Self-pay | Admitting: Medical

## 2018-08-05 NOTE — Telephone Encounter (Signed)
I am happy to report that her mammogram was normal, no worrisome findings.

## 2018-08-05 NOTE — Telephone Encounter (Signed)
Left message on voicemail for patient to call back for mammogram results.

## 2018-08-05 NOTE — Telephone Encounter (Signed)
Patient has been informed of her results.

## 2018-08-26 ENCOUNTER — Telehealth: Payer: Self-pay | Admitting: Medical

## 2018-08-26 NOTE — Telephone Encounter (Signed)
Pls refer to General Surgery, Dr. Georgette Dover, but also check on the gyn referral.  I did advise referral at physical, so not sure how this fell through cracks,

## 2018-08-26 NOTE — Telephone Encounter (Signed)
Pt is ready to proceed to surgery on hernia that has appeared on her other side now. She would like to go to [facility], Dr Verita Lamb, since he performed her other hernia surgery.  Also pt said she noticed in the notes that she has from her last CPE that she was to be referred to a GYN for polyps and she does not think she was referred because she never heard anything on the referral.  Call pt at [phone]

## 2018-08-28 ENCOUNTER — Other Ambulatory Visit: Payer: Self-pay

## 2018-08-28 DIAGNOSIS — K4090 Unilateral inguinal hernia, without obstruction or gangrene, not specified as recurrent: Secondary | ICD-10-CM

## 2018-08-28 DIAGNOSIS — N841 Polyp of cervix uteri: Secondary | ICD-10-CM

## 2018-08-28 DIAGNOSIS — Z124 Encounter for screening for malignant neoplasm of cervix: Secondary | ICD-10-CM

## 2018-08-28 NOTE — Telephone Encounter (Signed)
Faxed referral form to [facility].  Referral put in for GYN

## 2018-09-06 ENCOUNTER — Ambulatory Visit: Payer: Self-pay | Admitting: Surgery

## 2018-09-06 DIAGNOSIS — K4190 Unilateral femoral hernia, without obstruction or gangrene, not specified as recurrent: Secondary | ICD-10-CM | POA: Diagnosis not present

## 2018-09-09 ENCOUNTER — Ambulatory Visit: Payer: Self-pay | Admitting: Surgery

## 2018-09-09 NOTE — H&P (Signed)
History of Present Illness Kathryn Myers. Kathryn Perotti MD; 09/06/2018 6:21 PM) The patient is a 57 year old female who presents for an evaluation of a hernia. Referred by Chana Bode, PA-C for right hernia  This is a 57 year old female who is eleven years s/p left femoral hernia repair with mesh. She had been doing well but over the last two years, she has developed some swelling in her right groin. She had some unexplained weight loss and underwent a CT scan as part of her work-up. This showed an unexplained 4 cm subcutaneous fluid collection in the right groin, corresponding to the palpable bulge. The bulge fluctuates in size but has overall become larger and painful. This is very similar to her presentatioin in 2008. No changes in bowel habits.  CLINICAL DATA: 57 y/o female with unintended weight loss. Generalized abdominal pain.  EXAM: CT ABDOMEN AND PELVIS WITH CONTRAST  TECHNIQUE: Multidetector CT imaging of the abdomen and pelvis was performed using the standard protocol following bolus administration of intravenous contrast.  CONTRAST: 138mL ISOVUE-300 IOPAMIDOL (ISOVUE-300) INJECTION 61%  COMPARISON: Pelvis ultrasound 11/17/2015.  FINDINGS: Lower chest: Normal lung bases. No pericardial or pleural effusion.  Hepatobiliary: Solitary circumscribed simple fluid density 17 mm area in the central right liver dome appears to be a benign cyst. Normal liver parenchyma otherwise. Negative gallbladder.  Pancreas: Negative.  Spleen: Negative.  Adrenals/Urinary Tract: Normal adrenal glands.  Bilateral renal enhancement and contrast excretion is symmetric and normal. Extrarenal pelvis suspected on the right (normal variant). No hydroureter or proximal periureteral stranding. Diminutive and unremarkable urinary bladder.  Stomach/Bowel: Negative rectum. Redundant sigmoid with retained stool. Similar retained stool in the left colon. Oral contrast has reached the mid descending  colon. Redundant splenic flexure and transverse colon containing contrast. Redundant hepatic flexure and right colon. Normal appendix (series 2, image 59). Negative terminal ileum. No dilated or abnormal small bowel loops identified. Negative stomach. The proximal duodenum is patulous but does not appear inflamed. The distal duodenum is negative.  No abdominal free fluid.  Vascular/Lymphatic: Minimal Calcified aortic atherosclerosis. Major arterial structures in the abdomen and pelvis are patent. Portal venous system is patent.  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 There is associated left gonadal venous contrast reflux. But other parametrial vasculature appears within normal limits; no evidence of overt pelvic venous congestion.  No lymphadenopathy in the abdomen.  No solid lymphadenopathy in the pelvis. However, there is a right inguinal round simple fluid density mass measuring 19 x 37 x 45 mm (AP by transverse by CC). See coronal image 13. This overlies the right common femoral neurovascular bundle and is in proximity to other diminutive right inguinal lymph nodes (e.g. Series 2, image 65). No similar soft tissue mass identified throughout the abdomen or pelvis.  Reproductive: Within normal limits.  Other: No pelvic free fluid.  Musculoskeletal: Negative.  IMPRESSION: 1. No acute process or abnormality identified in the abdomen or pelvis to explain weight loss or pain. 2. Fairly superficial right inguinal 3-4 cm simple fluid density collection. If the patient has ever had arterial or venipuncture in this region then this may reflect a chronic seroma. Otherwise benign lymphangioma is favored. There is no associated lymphadenopathy in the abdomen or pelvis. 3. Redundant large bowel with retained stool. Minimal aortic atherosclerosis. Benign appearing right hepatic cyst. Inconsequential appearing left  gonadal vein varices.   Electronically Signed By: Genevie Ann M.D. On: 10/19/2017 09:10  Problem List/Past Medical Rodman Key K. Naveen Clardy, MD; 09/06/2018 6:21 PM) FEMORAL HERNIA OF RIGHT SIDE WITHOUT OBSTRUCTION OR GANGRENE (K41.90)   Past Surgical History [employer], RMA; 09/06/2018 10:21 AM) Open Inguinal Hernia Surgery  Left.  Diagnostic Studies History Geni Bers [place], RMA; 09/06/2018 10:21 AM) Colonoscopy  5-10 years ago Mammogram  within last year Pap Smear  1-5 years ago  Allergies Geni Bers [facility], RMA; 09/06/2018 10:22 AM) No Known Drug Allergies [09/06/2018]: Allergies Reconciled   Medication History [employer], RMA; 09/06/2018 10:23 AM) Multi Vitamin (Oral) Active. Calcium 500/D (Oral) Specific strength unknown - Active. Fish Oil (Oral) Specific strength unknown - Active. B Complex (Oral) Specific strength unknown - Active. Aspirin (81MG  Tablet DR, Oral) Active. Medications Reconciled  Social History Geni Bers [place], RMA; 09/06/2018 10:21 AM) Alcohol use  Moderate alcohol use. Caffeine use  Coffee. Illicit drug use  Prefer to discuss with provider. Tobacco use  Former smoker.  Family History Geni Bers [place], RMA; 09/06/2018 10:21 AM) Arthritis  Mother. Cancer  Brother. Colon Polyps  Brother, Father. Hypertension  Father. Thyroid problems  Mother.  Pregnancy / Birth History Geni Bers [place], RMA; 09/06/2018 10:21 AM) Age at menarche  55 years. Age of menopause  44-55 Gravida  0  Other Problems Kathryn Myers. Debbra Digiulio, MD; 09/06/2018 6:21 PM) Inguinal Hernia  Melanoma     Review of Systems (Jacqueline Haggett RMA; 09/06/2018 10:21 AM) General Not Present- Appetite Loss, Chills, Fatigue, Fever, Night Sweats, Weight Gain and Weight Loss. Respiratory Not Present- Bloody sputum, Chronic Cough, Difficulty Breathing, Snoring and Wheezing. Gastrointestinal Not Present- Abdominal Pain, Bloating, Bloody Stool, Change  in Bowel Habits, Chronic diarrhea, Constipation, Difficulty Swallowing, Excessive gas, Gets full quickly at meals, Hemorrhoids, Indigestion, Nausea, Rectal Pain and Vomiting. Female Genitourinary Not Present- Frequency, Nocturia, Painful Urination, Pelvic Pain and Urgency. Musculoskeletal Not Present- Back Pain, Joint Pain, Joint Stiffness, Muscle Pain, Muscle Weakness and Swelling of Extremities. Neurological Not Present- Decreased Memory, Fainting, Headaches, Numbness, Seizures, Tingling, Tremor, Trouble walking and Weakness. Endocrine Not Present- Cold Intolerance, Excessive Hunger, Hair Changes, Heat Intolerance, Hot flashes and New Diabetes. Hematology Not Present- Blood Thinners, Easy Bruising, Excessive bleeding, Gland problems, HIV and Persistent Infections.  Vitals Geni Bers [place] RMA; 09/06/2018 10:23 AM) 09/06/2018 10:23 AM Weight: 107.6 lb Height: 67.5in Body Surface Area: 1.56 m Body Mass Index: 16.6 kg/m  Pain Level: 0/10 Temp.: 96.50F(Temporal)  Pulse: 80 (Regular)  P.OX: 99% (Room air) BP: 140/82 (Sitting, Left Arm, Standard)       Physical Exam Rodman Key K. Edu On MD; 09/06/2018 6:22 PM) The physical exam findings are as follows: Note:WDWN in NAD Eyes: Pupils equal, round; sclera anicteric HENT: Oral mucosa moist; good dentition Neck: No masses palpated, no thyromegaly Lungs: CTA bilaterally; normal respiratory effort CV: Regular rate and rhythm; no murmurs; extremities well-perfused with no edema Abd: +bowel sounds, soft, non-tender, no palpable organomegaly; healed left inguinal incision - no sign of palpable hernia Right groin - palpable subcutaneous fluid-filled mass; partially reducible; mildly tender; no erythema Skin: Warm, dry; no sign of jaundice Psychiatric - alert and oriented x 4; calm mood and affect    Assessment & Plan Rodman Key K. Wasil Wolke MD; 09/06/2018 10:43 AM) FEMORAL HERNIA OF RIGHT SIDE WITHOUT OBSTRUCTION OR GANGRENE  (K41.90) Current Plans Schedule for Surgery - Right femoral hernia repair with mesh. The surgical procedure has been discussed with the patient. Potential risks, benefits, alternative treatments, and expected outcomes have been explained. All of the patient's questions at this time have been answered. The likelihood of reaching the patient's  treatment goal is good. The patient understand the proposed surgical procedure and wishes to proceed.  Kathryn Myers. Georgette Dover, MD, [facility] Surgery  General/ Trauma Surgery Beeper [phone]  09/09/2018 1:16 PM

## 2018-09-27 ENCOUNTER — Encounter: Payer: Self-pay | Admitting: Gynecology

## 2018-09-27 ENCOUNTER — Ambulatory Visit: Payer: 59 | Admitting: Gynecology

## 2018-09-27 VITALS — BP 118/76 | Ht 68.0 in | Wt 109.0 lb

## 2018-09-27 DIAGNOSIS — N841 Polyp of cervix uteri: Secondary | ICD-10-CM

## 2018-09-27 DIAGNOSIS — Z23 Encounter for immunization: Secondary | ICD-10-CM

## 2018-09-27 DIAGNOSIS — Z124 Encounter for screening for malignant neoplasm of cervix: Secondary | ICD-10-CM | POA: Diagnosis not present

## 2018-09-27 NOTE — Addendum Note (Signed)
Addended by: Nelva Nay on: 09/27/2018 10:13 AM   Modules accepted: Orders

## 2018-09-27 NOTE — Patient Instructions (Signed)
Office will call you with biopsy results 

## 2018-09-27 NOTE — Progress Notes (Signed)
    Kathryn Myers [birth year] [id number]        57 y.o.  G0P0000 new patient presents having been seen last year for annual exam by her primary provider where a cervical polyp was visualized.  Pap smear done at that time was normal.  She was to be referred to gynecology although did not hear until recently she called to follow-up on this and was referred for evaluation.  She has had no bleeding or other GYN symptoms.  History of abnormal Pap smear in college but normal Pap smears since.  Past medical history,surgical history, problem list, medications, allergies, family history and social history were all reviewed and documented in the EPIC chart.  Directed ROS with pertinent positives and negatives documented in the history of present illness/assessment and plan.  Exam: Caryn Bee assistant Vitals:   09/27/18 0921  BP: 118/76  Weight: 109 lb (49.4 kg)  Height: 5\' 8"  (1.727 m)   General appearance:  Normal Abdomen soft nontender without masses guarding rebound Pelvic external BUS vagina with atrophic changes.  Cervix with small polyp extruding from external os.  Uterus normal size midline mobile nontender.  Adnexa without masses or tenderness.  Procedure: The polyp was biopsied off without difficulty.  Silver nitrate applied afterwards.  Specimen sent to pathology.  Patient tolerated well.  Assessment/Plan:  57 y.o. G0P0000 with small endocervical polyp.  Pap smear was done.  Polyp biopsied off.  Patient will follow-up for results.  Patient otherwise doing well and will continue with annual exams with her primary provider.    Anastasio Auerbach MD, 9:36 AM 09/27/2018

## 2018-09-27 NOTE — Addendum Note (Signed)
Addended by: Anastasio Auerbach on: 09/27/2018 10:05 AM   Modules accepted: Orders

## 2018-10-01 LAB — TISSUE SPECIMEN

## 2018-10-01 LAB — PAP IG W/ RFLX HPV ASCU

## 2018-10-01 LAB — PATHOLOGY

## 2018-10-16 ENCOUNTER — Other Ambulatory Visit: Payer: Self-pay

## 2018-10-16 ENCOUNTER — Encounter (HOSPITAL_BASED_OUTPATIENT_CLINIC_OR_DEPARTMENT_OTHER): Payer: Self-pay | Admitting: *Deleted

## 2018-10-21 NOTE — Progress Notes (Signed)
Ensure pre surgery drink given with instructions to complete by [place], surgical soap given with instructions, pt verbalized understanding.

## 2018-10-22 ENCOUNTER — Ambulatory Visit (HOSPITAL_BASED_OUTPATIENT_CLINIC_OR_DEPARTMENT_OTHER)
Admission: RE | Admit: 2018-10-22 | Discharge: 2018-10-22 | Disposition: A | Discharge status: Home / Self Care | Payer: 59 | Attending: Surgery | Admitting: Surgery

## 2018-10-22 ENCOUNTER — Ambulatory Visit (HOSPITAL_BASED_OUTPATIENT_CLINIC_OR_DEPARTMENT_OTHER): Payer: 59 | Admitting: Anesthesiology

## 2018-10-22 ENCOUNTER — Encounter (HOSPITAL_BASED_OUTPATIENT_CLINIC_OR_DEPARTMENT_OTHER): Payer: Self-pay

## 2018-10-22 ENCOUNTER — Other Ambulatory Visit: Payer: Self-pay

## 2018-10-22 ENCOUNTER — Encounter (HOSPITAL_BASED_OUTPATIENT_CLINIC_OR_DEPARTMENT_OTHER)
Admission: RE | Disposition: A | Discharge status: Home / Self Care | Payer: Self-pay | Source: Home / Self Care | Attending: Surgery

## 2018-10-22 DIAGNOSIS — Z87891 Personal history of nicotine dependence: Secondary | ICD-10-CM | POA: Insufficient documentation

## 2018-10-22 DIAGNOSIS — K7689 Other specified diseases of liver: Secondary | ICD-10-CM | POA: Diagnosis not present

## 2018-10-22 DIAGNOSIS — K4190 Unilateral femoral hernia, without obstruction or gangrene, not specified as recurrent: Secondary | ICD-10-CM | POA: Diagnosis not present

## 2018-10-22 DIAGNOSIS — Z79899 Other long term (current) drug therapy: Secondary | ICD-10-CM | POA: Insufficient documentation

## 2018-10-22 DIAGNOSIS — I700 Atherosclerosis of aorta: Secondary | ICD-10-CM | POA: Insufficient documentation

## 2018-10-22 DIAGNOSIS — Z7982 Long term (current) use of aspirin: Secondary | ICD-10-CM | POA: Insufficient documentation

## 2018-10-22 HISTORY — PX: INSERTION OF MESH: SHX5868

## 2018-10-22 HISTORY — PX: FEMORAL HERNIA REPAIR: SHX632

## 2018-10-22 HISTORY — DX: Unilateral femoral hernia, without obstruction or gangrene, not specified as recurrent: K41.90

## 2018-10-22 SURGERY — HERNIA REPAIR FEMORAL
Anesthesia: General | Site: Groin | Laterality: Right

## 2018-10-22 MED ORDER — HYDROCODONE-ACETAMINOPHEN 5-325 MG PO TABS: 1.0000 | ORAL_TABLET | Freq: Four times a day (QID) | ORAL | 0 refills | Status: DC | PRN

## 2018-10-22 MED ORDER — PROMETHAZINE HCL 25 MG/ML IJ SOLN: 6.2500 mg | INTRAMUSCULAR | Status: DC | PRN

## 2018-10-22 MED ORDER — OXYCODONE HCL 5 MG PO TABS: 5.0000 mg | ORAL_TABLET | Freq: Once | ORAL | Status: DC | PRN

## 2018-10-22 MED ORDER — PROPOFOL 10 MG/ML IV BOLUS
INTRAVENOUS | Status: DC | PRN
  Administered 2018-10-22: 150 mg via INTRAVENOUS

## 2018-10-22 MED ORDER — BUPIVACAINE-EPINEPHRINE 0.25% -1:200000 IJ SOLN
INTRAMUSCULAR | Status: DC | PRN
  Administered 2018-10-22: 10 mL

## 2018-10-22 MED ORDER — ACETAMINOPHEN 500 MG PO TABS
1000.0000 mg | ORAL_TABLET | ORAL | Status: AC
  Administered 2018-10-22: 1000 mg via ORAL

## 2018-10-22 MED ORDER — FENTANYL CITRATE (PF) 100 MCG/2ML IJ SOLN
INTRAMUSCULAR | Status: AC
  Filled 2018-10-22: qty 2

## 2018-10-22 MED ORDER — LIDOCAINE HCL (CARDIAC) PF 100 MG/5ML IV SOSY
PREFILLED_SYRINGE | INTRAVENOUS | Status: DC | PRN
  Administered 2018-10-22: 70 mg via INTRAVENOUS

## 2018-10-22 MED ORDER — ONDANSETRON HCL 4 MG/2ML IJ SOLN
INTRAMUSCULAR | Status: DC | PRN
  Administered 2018-10-22: 4 mg via INTRAVENOUS

## 2018-10-22 MED ORDER — GABAPENTIN 300 MG PO CAPS
ORAL_CAPSULE | ORAL | Status: AC
  Filled 2018-10-22: qty 1

## 2018-10-22 MED ORDER — DEXAMETHASONE SODIUM PHOSPHATE 10 MG/ML IJ SOLN
INTRAMUSCULAR | Status: AC
  Filled 2018-10-22: qty 1

## 2018-10-22 MED ORDER — LACTATED RINGERS IV SOLN
INTRAVENOUS | Status: DC | PRN
  Administered 2018-10-22: 09:00:00 via INTRAVENOUS

## 2018-10-22 MED ORDER — GABAPENTIN 300 MG PO CAPS
300.0000 mg | ORAL_CAPSULE | ORAL | Status: AC
  Administered 2018-10-22: 300 mg via ORAL

## 2018-10-22 MED ORDER — DEXAMETHASONE SODIUM PHOSPHATE 10 MG/ML IJ SOLN
INTRAMUSCULAR | Status: DC | PRN
  Administered 2018-10-22: 10 mg via INTRAVENOUS

## 2018-10-22 MED ORDER — ONDANSETRON HCL 4 MG/2ML IJ SOLN
INTRAMUSCULAR | Status: AC
  Filled 2018-10-22: qty 2

## 2018-10-22 MED ORDER — FENTANYL CITRATE (PF) 100 MCG/2ML IJ SOLN
INTRAMUSCULAR | Status: DC | PRN
  Administered 2018-10-22: 25 ug via INTRAVENOUS
  Administered 2018-10-22: 50 ug via INTRAVENOUS
  Administered 2018-10-22: 25 ug via INTRAVENOUS

## 2018-10-22 MED ORDER — CEFAZOLIN SODIUM-DEXTROSE 2-4 GM/100ML-% IV SOLN
INTRAVENOUS | Status: AC
  Filled 2018-10-22: qty 100

## 2018-10-22 MED ORDER — PROPOFOL 10 MG/ML IV BOLUS
INTRAVENOUS | Status: AC
  Filled 2018-10-22: qty 20

## 2018-10-22 MED ORDER — MEPERIDINE HCL 25 MG/ML IJ SOLN: 6.2500 mg | INTRAMUSCULAR | Status: DC | PRN

## 2018-10-22 MED ORDER — ACETAMINOPHEN 500 MG PO TABS
ORAL_TABLET | ORAL | Status: AC
  Filled 2018-10-22: qty 2

## 2018-10-22 MED ORDER — OXYCODONE HCL 5 MG/5ML PO SOLN: 5.0000 mg | Freq: Once | ORAL | Status: DC | PRN

## 2018-10-22 MED ORDER — CHLORHEXIDINE GLUCONATE CLOTH 2 % EX PADS: 6.0000 | MEDICATED_PAD | Freq: Once | CUTANEOUS | Status: DC

## 2018-10-22 MED ORDER — FENTANYL CITRATE (PF) 100 MCG/2ML IJ SOLN: 25.0000 ug | INTRAMUSCULAR | Status: DC | PRN

## 2018-10-22 MED ORDER — LIDOCAINE 2% (20 MG/ML) 5 ML SYRINGE
INTRAMUSCULAR | Status: AC
  Filled 2018-10-22: qty 5

## 2018-10-22 MED ORDER — CEFAZOLIN SODIUM-DEXTROSE 2-4 GM/100ML-% IV SOLN
2.0000 g | INTRAVENOUS | Status: AC
  Administered 2018-10-22: 2 g via INTRAVENOUS

## 2018-10-22 SURGICAL SUPPLY — 53 items
BENZOIN TINCTURE PRP APPL 2/3 (GAUZE/BANDAGES/DRESSINGS) ×2 IMPLANT
BLADE CLIPPER SURG (BLADE) ×2 IMPLANT
BLADE HEX COATED 2.75 (ELECTRODE) ×2 IMPLANT
BLADE SURG 15 STRL LF DISP TIS (BLADE) ×2 IMPLANT
BLADE SURG 15 STRL SS (BLADE) ×2
CANISTER SUCT 1200ML W/VALVE (MISCELLANEOUS) IMPLANT
CHLORAPREP W/TINT 26ML (MISCELLANEOUS) ×2 IMPLANT
COVER BACK TABLE 60X90IN (DRAPES) ×2 IMPLANT
COVER MAYO STAND STRL (DRAPES) ×2 IMPLANT
COVER WAND RF STERILE (DRAPES) IMPLANT
DECANTER SPIKE VIAL GLASS SM (MISCELLANEOUS) ×2 IMPLANT
DRAIN PENROSE 1/2X12 LTX STRL (WOUND CARE) IMPLANT
DRAPE LAPAROTOMY TRNSV 102X78 (DRAPE) ×2 IMPLANT
DRAPE UTILITY XL STRL (DRAPES) ×2 IMPLANT
DRSG TEGADERM 4X4.75 (GAUZE/BANDAGES/DRESSINGS) ×2 IMPLANT
ELECT REM PT RETURN 9FT ADLT (ELECTROSURGICAL) ×2
ELECTRODE REM PT RTRN 9FT ADLT (ELECTROSURGICAL) ×1 IMPLANT
GAUZE SPONGE 4X4 12PLY STRL LF (GAUZE/BANDAGES/DRESSINGS) ×2 IMPLANT
GLOVE BIO SURGEON STRL SZ 6.5 (GLOVE) ×2 IMPLANT
GLOVE BIO SURGEON STRL SZ7 (GLOVE) ×2 IMPLANT
GLOVE BIOGEL PI IND STRL 7.0 (GLOVE) ×1 IMPLANT
GLOVE BIOGEL PI IND STRL 7.5 (GLOVE) ×1 IMPLANT
GLOVE BIOGEL PI INDICATOR 7.0 (GLOVE) ×1
GLOVE BIOGEL PI INDICATOR 7.5 (GLOVE) ×1
GOWN STRL REUS W/ TWL LRG LVL3 (GOWN DISPOSABLE) ×2 IMPLANT
GOWN STRL REUS W/TWL LRG LVL3 (GOWN DISPOSABLE) ×2
MESH ULTRAPRO 3X6 7.6X15CM (Mesh General) ×2 IMPLANT
NEEDLE HYPO 25X1 1.5 SAFETY (NEEDLE) ×2 IMPLANT
NS IRRIG 1000ML POUR BTL (IV SOLUTION) ×2 IMPLANT
PACK BASIN DAY SURGERY FS (CUSTOM PROCEDURE TRAY) ×2 IMPLANT
PENCIL BUTTON HOLSTER BLD 10FT (ELECTRODE) ×2 IMPLANT
SLEEVE SCD COMPRESS KNEE MED (MISCELLANEOUS) ×2 IMPLANT
SPONGE INTESTINAL PEANUT (DISPOSABLE) ×2 IMPLANT
SPONGE LAP 18X18 RF (DISPOSABLE) ×2 IMPLANT
STRIP CLOSURE SKIN 1/2X4 (GAUZE/BANDAGES/DRESSINGS) ×2 IMPLANT
SUT MON AB 4-0 PC3 18 (SUTURE) ×2 IMPLANT
SUT PDS AB 0 CT 36 (SUTURE) IMPLANT
SUT SILK 2 0 SH (SUTURE) IMPLANT
SUT SILK 3 0 SH 30 (SUTURE) IMPLANT
SUT SILK 3 0 TIES 17X18 (SUTURE)
SUT SILK 3-0 18XBRD TIE BLK (SUTURE) IMPLANT
SUT VIC AB 0 CT1 27 (SUTURE) ×1
SUT VIC AB 0 CT1 27XBRD ANBCTR (SUTURE) ×1 IMPLANT
SUT VIC AB 0 SH 27 (SUTURE) IMPLANT
SUT VIC AB 2-0 SH 27 (SUTURE) ×1
SUT VIC AB 2-0 SH 27XBRD (SUTURE) ×1 IMPLANT
SUT VIC AB 3-0 SH 27 (SUTURE) ×1
SUT VIC AB 3-0 SH 27X BRD (SUTURE) ×1 IMPLANT
SYR CONTROL 10ML LL (SYRINGE) ×2 IMPLANT
TOWEL GREEN STERILE FF (TOWEL DISPOSABLE) ×2 IMPLANT
TOWEL OR NON WOVEN STRL DISP B (DISPOSABLE) ×2 IMPLANT
TUBE CONNECTING 20X1/4 (TUBING) ×2 IMPLANT
YANKAUER SUCT BULB TIP NO VENT (SUCTIONS) ×2 IMPLANT

## 2018-10-22 NOTE — Anesthesia Postprocedure Evaluation (Signed)
Anesthesia Post Note  Patient: Kathryn Myers  Procedure(s) Performed: RIGHT FEMORAL HERNIA REPAIR WITH MESH (Right Groin) INSERTION OF MESH (Right Groin)     Anesthesia Post Evaluation  Last Vitals:  Vitals:   10/22/18 0935 10/22/18 1020  BP:  (!) 145/88  Pulse: 64 (!) 59  Resp: 10 18  Temp:  36.5 C  SpO2: 100% 100%    Last Pain:  Vitals:   10/22/18 1020  TempSrc:   PainSc: 0-No pain                 Nolon Nations

## 2018-10-22 NOTE — H&P (Signed)
History of Present Illness  The patient is a 57 year old female who presents for an evaluation of a hernia.   Referred by Chana Bode, PA-C for right hernia  This is a 57 year old female who is eleven years s/p left femoral hernia repair with mesh. She had been doing well but over the last two years, she has developed some swelling in her right groin. She had some unexplained weight loss and underwent a CT scan as part of her work-up. This showed an unexplained 4 cm subcutaneous fluid collection in the right groin, corresponding to the palpable bulge. The bulge fluctuates in size but has overall become larger and painful. This is very similar to her presentatioin in 2008. No changes in bowel habits.  CLINICAL DATA: 57 y/o female with unintended weight loss. Generalized abdominal pain.  EXAM: CT ABDOMEN AND PELVIS WITH CONTRAST  TECHNIQUE: Multidetector CT imaging of the abdomen and pelvis was performed using the standard protocol following bolus administration of intravenous contrast.  CONTRAST: 171mL ISOVUE-300 IOPAMIDOL (ISOVUE-300) INJECTION 61%  COMPARISON: Pelvis ultrasound 11/17/2015.  FINDINGS: Lower chest: Normal lung bases. No pericardial or pleural effusion.  Hepatobiliary: Solitary circumscribed simple fluid density 17 mm area in the central right liver dome appears to be a benign cyst. Normal liver parenchyma otherwise. Negative gallbladder.  Pancreas: Negative.  Spleen: Negative.  Adrenals/Urinary Tract: Normal adrenal glands.  Bilateral renal enhancement and contrast excretion is symmetric and normal. Extrarenal pelvis suspected on the right (normal variant). No hydroureter or proximal periureteral stranding. Diminutive and unremarkable urinary bladder.  Stomach/Bowel: Negative rectum. Redundant sigmoid with retained stool. Similar retained stool in the left colon. Oral contrast has reached the mid descending colon. Redundant splenic  flexure and transverse colon containing contrast. Redundant hepatic flexure and right colon. Normal appendix (series 2, image 59). Negative terminal ileum. No dilated or abnormal small bowel loops identified. Negative stomach. The proximal duodenum is patulous but does not appear inflamed. The distal duodenum is negative.  No abdominal free fluid.  Vascular/Lymphatic: Minimal Calcified aortic atherosclerosis. Major arterial structures in the abdomen and pelvis are patent. Portal venous system is patent.  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 There is associated left gonadal venous contrast reflux. But other parametrial vasculature appears within normal limits; no evidence of overt pelvic venous congestion.  No lymphadenopathy in the abdomen.  No solid lymphadenopathy in the pelvis. However, there is a right inguinal round simple fluid density mass measuring 19 x 37 x 45 mm (AP by transverse by CC). See coronal image 13. This overlies the right common femoral neurovascular bundle and is in proximity to other diminutive right inguinal lymph nodes (e.g. Series 2, image 65). No similar soft tissue mass identified throughout the abdomen or pelvis.  Reproductive: Within normal limits.  Other: No pelvic free fluid.  Musculoskeletal: Negative.  IMPRESSION: 1. No acute process or abnormality identified in the abdomen or pelvis to explain weight loss or pain. 2. Fairly superficial right inguinal 3-4 cm simple fluid density collection. If the patient has ever had arterial or venipuncture in this region then this may reflect a chronic seroma. Otherwise benign lymphangioma is favored. There is no associated lymphadenopathy in the abdomen or pelvis. 3. Redundant large bowel with retained stool. Minimal aortic atherosclerosis. Benign appearing right hepatic cyst. Inconsequential appearing left gonadal vein  varices.   Electronically Signed By: Genevie Ann M.D. On: 10/19/2017 09:10   Problem List/Past Medical  FEMORAL HERNIA OF RIGHT SIDE WITHOUT OBSTRUCTION OR GANGRENE (K41.90)   Past Surgical History  Open Inguinal Hernia Surgery  Left.  Diagnostic Studies History  Colonoscopy  5-10 years ago Mammogram  within last year Pap Smear  1-5 years ago  Allergies  No Known Drug Allergies  Allergies Reconciled   Medication History  Multi Vitamin (Oral) Active. Calcium 500/D (Oral) Specific strength unknown - Active. Fish Oil (Oral) Specific strength unknown - Active. B Complex (Oral) Specific strength unknown - Active. Aspirin (81MG  Tablet DR, Oral) Active. Medications Reconciled  Social History  Alcohol use  Moderate alcohol use. Caffeine use  Coffee. Illicit drug use  Prefer to discuss with provider. Tobacco use  Former smoker.  Family History Arthritis  Mother. Cancer  Brother. Colon Polyps  Brother, Father. Hypertension  Father. Thyroid problems  Mother.  Pregnancy / Birth History  Age at menarche  22 years. Age of menopause  8-55 Gravida  0  Other Problems  Inguinal Hernia  Melanoma     Review of Systems  General Not Present- Appetite Loss, Chills, Fatigue, Fever, Night Sweats, Weight Gain and Weight Loss. Respiratory Not Present- Bloody sputum, Chronic Cough, Difficulty Breathing, Snoring and Wheezing. Gastrointestinal Not Present- Abdominal Pain, Bloating, Bloody Stool, Change in Bowel Habits, Chronic diarrhea, Constipation, Difficulty Swallowing, Excessive gas, Gets full quickly at meals, Hemorrhoids, Indigestion, Nausea, Rectal Pain and Vomiting. Female Genitourinary Not Present- Frequency, Nocturia, Painful Urination, Pelvic Pain and Urgency. Musculoskeletal Not Present- Back Pain, Joint Pain, Joint Stiffness, Muscle Pain, Muscle Weakness and Swelling of Extremities. Neurological Not Present- Decreased Memory,  Fainting, Headaches, Numbness, Seizures, Tingling, Tremor, Trouble walking and Weakness. Endocrine Not Present- Cold Intolerance, Excessive Hunger, Hair Changes, Heat Intolerance, Hot flashes and New Diabetes. Hematology Not Present- Blood Thinners, Easy Bruising, Excessive bleeding, Gland problems, HIV and Persistent Infections.  Vitals  Weight: 107.6 lb Height: 67.5in Body Surface Area: 1.56 m Body Mass Index: 16.6 kg/m  Pain Level: 0/10 Temp.: 96.19F(Temporal)  Pulse: 80 (Regular)  P.OX: 99% (Room air) BP: 140/82 (Sitting, Left Arm, Standard)       Physical Exam  The physical exam findings are as follows: Note:WDWN in NAD Eyes: Pupils equal, round; sclera anicteric HENT: Oral mucosa moist; good dentition Neck: No masses palpated, no thyromegaly Lungs: CTA bilaterally; normal respiratory effort CV: Regular rate and rhythm; no murmurs; extremities well-perfused with no edema Abd: +bowel sounds, soft, non-tender, no palpable organomegaly; healed left inguinal incision - no sign of palpable hernia Right groin - palpable subcutaneous fluid-filled mass; partially reducible; mildly tender; no erythema Skin: Warm, dry; no sign of jaundice Psychiatric - alert and oriented x 4; calm mood and affect    Assessment & Plan FEMORAL HERNIA OF RIGHT SIDE WITHOUT OBSTRUCTION OR GANGRENE (K41.90) Current Plans Schedule for Surgery - Right femoral hernia repair with mesh. The surgical procedure has been discussed with the patient. Potential risks, benefits, alternative treatments, and expected outcomes have been explained. All of the patient's questions at this time have been answered. The likelihood of reaching the patient's treatment goal is good. The patient understand the proposed surgical procedure and wishes to proceed.   Kathryn Myers. Georgette Dover, MD, [facility] Surgery  General/ Trauma Surgery Beeper [phone]  10/22/2018 8:10 AM

## 2018-10-22 NOTE — Anesthesia Procedure Notes (Signed)
Procedure Name: LMA Insertion Date/Time: 10/22/2018 8:41 AM Performed by: Laretta Alstrom, CRNA Pre-anesthesia Checklist: Patient identified, Emergency Drugs available, Suction available, Patient being monitored and Timeout performed Patient Re-evaluated:Patient Re-evaluated prior to induction Oxygen Delivery Method: Simple face mask Preoxygenation: Pre-oxygenation with 100% oxygen Induction Type: IV induction Ventilation: Mask ventilation without difficulty LMA Size: 4.0

## 2018-10-22 NOTE — Anesthesia Preprocedure Evaluation (Signed)
Anesthesia Evaluation  Patient identified by MRN, date of birth, ID band Patient awake    Reviewed: Allergy & Precautions, NPO status , Patient's Chart, lab work & pertinent test results  Airway Mallampati: I  TM Distance: >3 FB Neck ROM: Full    Dental  (+) Dental Advisory Given   Pulmonary neg pulmonary ROS, former smoker,    Pulmonary exam normal breath sounds clear to auscultation       Cardiovascular negative cardio ROS Normal cardiovascular exam Rhythm:Regular Rate:Normal     Neuro/Psych negative neurological ROS  negative psych ROS   GI/Hepatic negative GI ROS, Neg liver ROS,   Endo/Other  negative endocrine ROS  Renal/GU negative Renal ROS     Musculoskeletal negative musculoskeletal ROS (+)   Abdominal   Peds  Hematology negative hematology ROS (+)   Anesthesia Other Findings   Reproductive/Obstetrics negative OB ROS                             Anesthesia Physical Anesthesia Plan  ASA: II  Anesthesia Plan: General   Post-op Pain Management:    Induction: Intravenous  PONV Risk Score and Plan: 4 or greater and Ondansetron, Dexamethasone, Scopolamine patch - Pre-op and Midazolam  Airway Management Planned: LMA and Oral ETT  Additional Equipment:   Intra-op Plan:   Post-operative Plan: Extubation in OR  Informed Consent: I have reviewed the patients History and Physical, chart, labs and discussed the procedure including the risks, benefits and alternatives for the proposed anesthesia with the patient or authorized representative who has indicated his/her understanding and acceptance.   Dental advisory given  Plan Discussed with: CRNA  Anesthesia Plan Comments:         Anesthesia Quick Evaluation

## 2018-10-22 NOTE — Op Note (Signed)
Preop diagnosis: Right femoral hernia Postop diagnosis: Same Procedure performed: Right femoral hernia repair with mesh Surgeon:Elanna Bert K Shalinda Burkholder Anesthesia: General via LMA Indications:  This is a 57 year old female who is eleven years s/p left femoral hernia repair with mesh. She had been doing well but over the last two years, she has developed some swelling in her right groin. She had some unexplained weight loss and underwent a CT scan as part of her work-up. This showed an unexplained 4 cm subcutaneous fluid collection in the right groin, corresponding to the palpable bulge. The bulge fluctuates in size but has overall become larger and painful. This is very similar to her presentatioin in 2008. No changes in bowel habits.  Description of procedure: The patient is brought to the operating room and placed in the supine position on the operating room table.  After an adequate level of general anesthesia was obtained we prepped the right groin with ChloraPrep and draped in sterile fashion.  A timeout was taken to ensure the proper patient and proper procedure.  We infiltrated the area over the right inguinal ligament with 0.25% Marcaine with epinephrine.  I made a 3 cm incision directly over the inguinal ligament.  We dissected down through the subcutaneous tissues until we encountered the external oblique fascia and the inguinal ligament.  The patient has an obvious bulge protruding below the inguinal ligament.  This seems to be fluid-filled as well as containing a small amount of fat.  We open the sac and encountered some peritoneal fluid.  There is some omentum that is adherent to the size of the hernia sac.  We dissected the omentum free.  We were able to reduce this through a very tight 7 mm defect.  We excised the hernia sac.  No other hernia defects were palpated.  The defect is just anterior to the femoral vessels.  I took a 3 x 6 inch piece of ultra Pro mesh.  I cut a 2 inch x 3 inch piece  of mesh and rolled it into a tight roll.  One end was secured with 2-0 Vicryl.  This formed a small cone.  We inserted the cone of mesh into the hernia defect.  The edges of the mesh were secured to the surrounding tissue with interrupted 2-0 Vicryl sutures.  Some soft tissue was used to cover the entire mesh.  No other herniating tissue was noted.  The subcutaneous tissues were closed with 3-0 Vicryl.  4-0 Monocryl was used to close the skin.  Benzoin and Steri-Strips were applied.  The patient was then extubated and brought to the recovery room in stable condition.  All sponge, instrument, and needle counts are correct.  Imogene Burn. Georgette Dover, MD, [facility] Surgery  General/ Trauma Surgery Beeper [phone]  10/22/2018 9:30 AM

## 2018-10-22 NOTE — Discharge Instructions (Signed)
[facility], [place]   HERNIA REPAIR: POST OP INSTRUCTIONS  Always review your discharge instruction sheet given to you by the facility where your surgery was performed. IF YOU HAVE DISABILITY OR FAMILY LEAVE FORMS, YOU MUST BRING THEM TO THE OFFICE FOR PROCESSING.   DO NOT GIVE THEM TO YOUR DOCTOR.  1. A  prescription for pain medication may be given to you upon discharge.  Take your pain medication as prescribed, if needed.  If narcotic pain medicine is not needed, then you may take acetaminophen (Tylenol) or ibuprofen (Advil) as needed. 2. Take your usually prescribed medications unless otherwise directed. 3. If you need a refill on your pain medication, please contact your pharmacy.  They will contact our office to request authorization. Prescriptions will not be filled after 5 pm or on week-ends. 4. You should follow a light diet the first 24 hours after arrival home, such as soup and crackers, etc.  Be sure to include lots of fluids daily.  Resume your normal diet the day after surgery. 5. Most patients will experience some swelling and bruising around the umbilicus or in the groin and scrotum.  Ice packs and reclining will help.  Swelling and bruising can take several days to resolve.  6. It is common to experience some constipation if taking pain medication after surgery.  Increasing fluid intake and taking a stool softener (such as Colace) will usually help or prevent this problem from occurring.  A mild laxative (Milk of Magnesia or Miralax) should be taken according to package directions if there are no bowel movements after 48 hours. 7. Unless discharge instructions indicate otherwise, you may remove your bandages 24-48 hours after surgery, and you may shower at that time.  You will have steri-strips (small skin tapes) in place directly over the incision.  These strips should be left on the skin for 7-10 days. 8. ACTIVITIES:  You may resume regular (light) daily activities  beginning the next day--such as daily self-care, walking, climbing stairs--gradually increasing activities as tolerated.  You may have sexual intercourse when it is comfortable.  Refrain from any heavy lifting or straining until approved by your doctor. a. You may drive when you are no longer taking prescription pain medication, you can comfortably wear a seatbelt, and you can safely maneuver your car and apply brakes. b. RETURN TO WORK:  2-3 weeks with light duty - no lifting over 15 lbs. 9. You should see your doctor in the office for a follow-up appointment approximately 2-3 weeks after your surgery.  Make sure that you call for this appointment within a day or two after you arrive home to insure a convenient appointment time. 10. OTHER INSTRUCTIONS:  __________________________________________________________________________________________________________________________________________________________________________________________  WHEN TO CALL YOUR DOCTOR: 1. Fever over 101.0 2. Inability to urinate 3. Nausea and/or vomiting 4. Extreme swelling or bruising 5. Continued bleeding from incision. 6. Increased pain, redness, or drainage from the incision  The clinic staff is available to answer your questions during regular business hours.  Please dont hesitate to call and ask to speak to one of the nurses for clinical concerns.  If you have a medical emergency, go to the nearest emergency room or call 911.  A surgeon from [facility] is always on call at the hospital   [address], [place], [place], [place]  [address] ?  [address], [place], [place]   [address] [phone]    FAX [phone] Web site: [email or web address]  You had 1000 mg of Tylenol at 7:50am   Post Anesthesia Home Care Instructions  Activity: Get plenty of rest for the remainder of the day. A responsible individual must stay with you for 24 hours following the  procedure.  For the next 24 hours, DO NOT: -Drive a car -Paediatric nurse -Drink alcoholic beverages -Take any medication unless instructed by your physician -Make any legal decisions or sign important papers.  Meals: Start with liquid foods such as gelatin or soup. Progress to regular foods as tolerated. Avoid greasy, spicy, heavy foods. If nausea and/or vomiting occur, drink only clear liquids until the nausea and/or vomiting subsides. Call your physician if vomiting continues.  Special Instructions/Symptoms: Your throat may feel dry or sore from the anesthesia or the breathing tube placed in your throat during surgery. If this causes discomfort, gargle with warm salt water. The discomfort should disappear within 24 hours.  If you had a scopolamine patch placed behind your ear for the management of post- operative nausea and/or vomiting:  1. The medication in the patch is effective for 72 hours, after which it should be removed.  Wrap patch in a tissue and discard in the trash. Wash hands thoroughly with soap and water. 2. You may remove the patch earlier than 72 hours if you experience unpleasant side effects which may include dry mouth, dizziness or visual disturbances. 3. Avoid touching the patch. Wash your hands with soap and water after contact with the patch.

## 2018-10-22 NOTE — Transfer of Care (Signed)
Immediate Anesthesia Transfer of Care Note  Patient: Kathryn Myers  Procedure(s) Performed: RIGHT FEMORAL HERNIA REPAIR WITH MESH (Right Groin) INSERTION OF MESH (Right Groin)  Patient Location: PACU  Anesthesia Type:General  Level of Consciousness: awake, alert , oriented and patient cooperative  Airway & Oxygen Therapy: Patient Spontanous Breathing and Patient connected to face mask oxygen  Post-op Assessment: Report given to RN and Post -op Vital signs reviewed and stable  Post vital signs: Reviewed and stable  Last Vitals:  Vitals Value Taken Time  BP    Temp    Pulse 66 10/22/2018  9:32 AM  Resp    SpO2 100 % 10/22/2018  9:32 AM  Vitals shown include unvalidated device data.  Last Pain:  Vitals:   10/22/18 0748  TempSrc: Oral  PainSc: 0-No pain         Complications: No apparent anesthesia complications

## 2018-10-23 ENCOUNTER — Encounter (HOSPITAL_BASED_OUTPATIENT_CLINIC_OR_DEPARTMENT_OTHER): Payer: Self-pay | Admitting: Surgery

## 2018-10-24 ENCOUNTER — Emergency Department (HOSPITAL_COMMUNITY)
Admission: EM | Admit: 2018-10-24 | Discharge: 2018-10-24 | Disposition: A | Discharge status: Home / Self Care | Payer: 59 | Attending: Emergency Medicine | Admitting: Emergency Medicine

## 2018-10-24 ENCOUNTER — Encounter (HOSPITAL_COMMUNITY): Payer: Self-pay

## 2018-10-24 ENCOUNTER — Other Ambulatory Visit: Payer: Self-pay

## 2018-10-24 DIAGNOSIS — Y69 Unspecified misadventure during surgical and medical care: Secondary | ICD-10-CM | POA: Insufficient documentation

## 2018-10-24 DIAGNOSIS — T8130XA Disruption of wound, unspecified, initial encounter: Secondary | ICD-10-CM

## 2018-10-24 DIAGNOSIS — Z79899 Other long term (current) drug therapy: Secondary | ICD-10-CM | POA: Insufficient documentation

## 2018-10-24 DIAGNOSIS — Z87891 Personal history of nicotine dependence: Secondary | ICD-10-CM | POA: Insufficient documentation

## 2018-10-24 DIAGNOSIS — Z9889 Other specified postprocedural states: Secondary | ICD-10-CM | POA: Insufficient documentation

## 2018-10-24 LAB — CBC WITH DIFFERENTIAL/PLATELET
Abs Immature Granulocytes: 0.01 10*3/uL (ref 0.00–0.07)
Basophils Absolute: 0.1 10*3/uL (ref 0.0–0.1)
Basophils Relative: 1 %
Eosinophils Absolute: 0.1 10*3/uL (ref 0.0–0.5)
Eosinophils Relative: 1 %
HCT: 39.7 % (ref 36.0–46.0)
Hemoglobin: 13.3 g/dL (ref 12.0–15.0)
Immature Granulocytes: 0 %
Lymphocytes Relative: 48 %
Lymphs Abs: 3.2 10*3/uL (ref 0.7–4.0)
MCH: 33.2 pg (ref 26.0–34.0)
MCHC: 33.5 g/dL (ref 30.0–36.0)
MCV: 99 fL (ref 80.0–100.0)
Monocytes Absolute: 0.5 10*3/uL (ref 0.1–1.0)
Monocytes Relative: 7 %
Neutro Abs: 2.8 10*3/uL (ref 1.7–7.7)
Neutrophils Relative %: 43 %
Platelets: 244 10*3/uL (ref 150–400)
RBC: 4.01 MIL/uL (ref 3.87–5.11)
RDW: 12.6 % (ref 11.5–15.5)
WBC: 6.6 10*3/uL (ref 4.0–10.5)
nRBC: 0 % (ref 0.0–0.2)

## 2018-10-24 LAB — BASIC METABOLIC PANEL
Anion gap: 9 (ref 5–15)
BUN: 14 mg/dL (ref 6–20)
CO2: 27 mmol/L (ref 22–32)
Calcium: 9 mg/dL (ref 8.9–10.3)
Chloride: 103 mmol/L (ref 98–111)
Creatinine, Ser: 0.7 mg/dL (ref 0.44–1.00)
GFR calc Af Amer: >60 mL/min (ref >60)
GFR calc non Af Amer: >60 mL/min (ref >60)
Glucose, Bld: 85 mg/dL (ref 70–99)
Potassium: 3.8 mmol/L (ref 3.5–5.1)
Sodium: 139 mmol/L (ref 135–145)

## 2018-10-24 NOTE — ED Triage Notes (Signed)
Patient reports that she had a right femoral hernia repair x 2 days. Patien tis having active bleeding at this time.

## 2018-10-24 NOTE — Discharge Instructions (Addendum)
You have been diagnosed today with postoperative wound bleeding/dehiscence.  At this time there does not appear to be the presence of an emergent medical condition, however there is always the potential for conditions to change. Please read and follow the below instructions.  Please return to the Emergency Department immediately for any new or worsening symptoms or if your bleeding returns. Please be sure to follow up with your Primary Care Provider next week regarding your visit today; please call their office to schedule an appointment even if you are feeling better for a follow-up visit. Please keep the bandage in place and follow-up with your surgeons office tomorrow morning for recheck. Please avoid overexertion over the next day to avoid reinjury of the area.  Get help right away if: You have more redness, swelling, or pain around your wound. You have more fluid coming from your wound. Your wound feels warm to the touch. You have pus or a bad smell coming from your wound. More of the wound breaks open. You have red streaks spreading from your wound. You have a lot of bleeding from your wound. You have fever  Please read the additional information packets attached to your discharge summary.  Do not take your medicine if  develop an itchy rash, swelling in your mouth or lips, or difficulty breathing.

## 2018-10-24 NOTE — ED Provider Notes (Signed)
[facility] DEPT Provider Note   CSN: [id number] Arrival date & time: 10/24/18  1854     History   Chief Complaint Chief Complaint  Patient presents with  . post op bleeding    HPI Kathryn Myers is a 57 y.o. female presenting today for postoperative wound bleeding.  Patient had right femoral hernia mesh repair performed on 10/22/2018.  Patient states that she was feeling well  until approximately 30 minutes prior to arrival.  Patient states that she had been walking up and down stairs at her home when she had a sudden onset of sharp and throbbing pain to her incision site that was moderate in intensity.  Patient states that pain was worsened with ambulation and palpation and she noticed bleeding through her surgical dressing.  Patient left this dressing in place and proceeded to the emergency department.  Upon my evaluation patient resting comfortably in bed, no acute distress.  Patient states that her pain has improved with lying still, surgical dressing in place with blood present.  HPI  Past Medical History:  Diagnosis Date  . Allergic rhinitis   . Femoral hernia of right side   . Hemorrhoids   . Hx of abnormal cervical Pap smear   . Osteopenia 11/2012   repeat in 3-5 years  . Venereal disease contact    uses condoms, husband is HIV+  . Wears glasses     Patient Active Problem List   Diagnosis Date Noted  . Routine general medical examination at a health care facility 09/24/2017  . Weight loss 09/24/2017  . Screening for cervical cancer 09/24/2017  . Vaccine counseling 09/24/2017  . Non-recurrent unilateral inguinal hernia without obstruction or gangrene 09/24/2017  . Cervical polyp 09/24/2017  . Encounter for hepatitis C screening test for low risk patient 09/24/2017  . Encounter for health maintenance examination in adult 10/25/2015  . Rhinitis, allergic 10/25/2015  . Venereal disease contact 10/25/2015  . Need for prophylactic  vaccination and inoculation against influenza 10/25/2015  . Edema 10/25/2015  . BMI less than 19,adult 10/25/2015  . LLQ abdominal tenderness 10/25/2015  . Osteopenia 11/02/2014    Past Surgical History:  Procedure Laterality Date  . COLONOSCOPY  12/2009   Dr. Benson Norway; sessile polyp, medium hemorrhoids, diverticulum  . FEMORAL HERNIA REPAIR Right 10/22/2018   Procedure: RIGHT FEMORAL HERNIA REPAIR WITH MESH;  Surgeon: Donnie Mesa, MD;  Location: [facility];  Service: General;  Laterality: Right;  . INGUINAL HERNIA REPAIR  2008   left  . INSERTION OF MESH Right 10/22/2018   Procedure: INSERTION OF MESH;  Surgeon: Donnie Mesa, MD;  Location: [facility];  Service: General;  Laterality: Right;     OB History    Gravida  0   Para  0   Term  0   Preterm  0   AB  0   Living  0     SAB  0   TAB  0   Ectopic  0   Multiple  0   Live Births  0            Home Medications    Prior to Admission medications   Medication Sig Start Date End Date Taking? Authorizing Provider  acetaminophen (TYLENOL) 500 MG tablet Take 1,000 mg by mouth every 6 (six) hours as needed for mild pain, moderate pain or headache.   Yes [provider]  b complex vitamins tablet Take 1 tablet by mouth  daily.   Yes [provider]  calcium-vitamin D (OSCAL WITH D) 500-200 MG-UNIT per tablet Take 1 tablet by mouth.   Yes [provider]  docusate sodium (COLACE) 100 MG capsule Take 100 mg by mouth daily as needed for mild constipation.   Yes [provider]  HYDROcodone-acetaminophen (NORCO/VICODIN) 5-325 MG tablet Take 1 tablet by mouth every 6 (six) hours as needed for moderate pain. 10/22/18  Yes Donnie Mesa, MD  ibuprofen (ADVIL,MOTRIN) 200 MG tablet Take 400 mg by mouth every 6 (six) hours as needed for headache, mild pain or moderate pain.   Yes [provider]  Multiple Vitamin (MULTIVITAMIN) tablet Take 1 tablet by  mouth daily.     Yes [provider]  Omega-3 Fatty Acids (FISH OIL PO) Take 1 capsule by mouth daily.    Yes [provider]    Family History Family History  Problem Relation Age of Onset  . Hyperlipidemia Mother   . Osteoporosis Mother   . Parkinsonism Father   . Hyperlipidemia Father   . Hypertension Father   . Heart disease Father   . Cancer Brother        bone cancer as a child  . Cancer Paternal Uncle        pancreatic  . Heart disease Maternal Grandfather   . Osteoporosis Maternal Grandmother   . Breast cancer Maternal Aunt 80  . Stroke Neg Hx   . Diabetes Neg Hx     Social History Social History   Tobacco Use  . Smoking status: Former Smoker    Packs/day: 0.25    Years: 10.00    Pack years: 2.50    Last attempt to quit: 10/27/1991    Years since quitting: 27.0  . Smokeless tobacco: Never Used  Substance Use Topics  . Alcohol use: Yes    Alcohol/week: 7.0 standard drinks    Types: 7 Glasses of wine per week    Comment: social  . Drug use: No     Allergies   Patient has no known allergies.   Review of Systems Review of Systems  Constitutional: Negative.  Negative for chills and fever.  Respiratory: Negative for shortness of breath.   Cardiovascular: Negative.  Negative for chest pain.  Gastrointestinal: Negative.  Negative for abdominal pain, diarrhea, nausea and vomiting.  Genitourinary: Negative.  Negative for dysuria and hematuria.  Musculoskeletal: Negative.  Negative for arthralgias and myalgias.  Skin: Positive for wound.  Neurological: Negative.  Negative for dizziness, syncope, weakness and light-headedness.  All other systems reviewed and are negative.  Physical Exam Updated Vital Signs BP (!) 145/87 (BP Location: Right Arm)   Pulse (!) 56   Temp 98.2 F (36.8 C) (Oral)   Resp 16   Ht 5\' 7"  (1.702 m)   Wt 49.8 kg   LMP 07/28/2011   SpO2 100%   BMI 17.20 kg/m   Physical Exam Constitutional:      General: She  is not in acute distress.    Appearance: Normal appearance. She is well-developed. She is not ill-appearing or diaphoretic.  HENT:     Head: Normocephalic and atraumatic.     Right Ear: External ear normal.     Left Ear: External ear normal.     Nose: Nose normal.  Eyes:     Pupils: Pupils are equal, round, and reactive to light.  Neck:     Musculoskeletal: Normal range of motion and neck supple.     Trachea: Trachea  normal. No tracheal deviation.  Cardiovascular:     Rate and Rhythm: Normal rate and regular rhythm.     Pulses: Normal pulses.          Dorsalis pedis pulses are 2+ on the right side and 2+ on the left side.       Posterior tibial pulses are 2+ on the right side and 2+ on the left side.     Heart sounds: Normal heart sounds.  Pulmonary:     Effort: Pulmonary effort is normal. No respiratory distress.     Breath sounds: Normal breath sounds.  Abdominal:     Palpations: Abdomen is soft.     Tenderness: There is no abdominal tenderness. There is no guarding or rebound.  Musculoskeletal: Normal range of motion.  Feet:     Right foot:     Protective Sensation: 3 sites tested. 3 sites sensed.     Left foot:     Protective Sensation: 3 sites tested. 3 sites sensed.  Skin:    General: Skin is warm and dry.     Capillary Refill: Capillary refill takes less than 2 seconds.          Comments: 4.5 cm surgical scar present.  Medial aspect of wound with small blood clot present and mild oozing.  This was removed revealing small area of dehiscence on medial aspect of wound, approximately 5 mm in length and less than 3 mm in width.  Neurological:     General: No focal deficit present.     Mental Status: She is alert and oriented to person, place, and time.     GCS: GCS eye subscore is 4. GCS verbal subscore is 5. GCS motor subscore is 6.     Comments: Speech is clear and goal oriented, follows commands Major Cranial nerves without deficit, no facial droop Normal strength in  upper and lower extremities bilaterally including dorsiflexion and plantar flexion, strong and equal grip strength Sensation normal to light touch Moves extremities without ataxia, coordination intact Normal gait  Psychiatric:        Mood and Affect: Mood normal.        Behavior: Behavior normal.    ED Treatments / Results  Labs (all labs ordered are listed, but only abnormal results are displayed) Labs Reviewed  CBC WITH DIFFERENTIAL/PLATELET  BASIC METABOLIC PANEL    EKG None  Radiology No results found.  Procedures Procedures (including critical care time)  Medications Ordered in ED Medications - No data to display   Initial Impression / Assessment and Plan / ED Course  I have reviewed the triage vital signs and the nursing notes.  Pertinent labs & imaging results that were available during my care of the patient were reviewed by me and considered in my medical decision making (see chart for details).  Clinical Course as of Oct 24 2158  Thu Oct 25, 5751  2080 57 year old female status post hernia repair by Dr. Georgette Dover couple of days ago.  Today while she was walking she had acute bleeding from the site with pain.  Pain is improved with her lying down.  There has not been any fever.  No trauma.  No numbness or weakness distal.   [MB]    Clinical Course User Index [MB] Hayden Rasmussen, MD   CBC and BMP within normal limits.  57 year old female presenting for postop wound bleeding that occurred just prior to arrival.  Right femoral hernia mesh placement.  Patient is afebrile, not  tachycardic, not hypotensive well-appearing and in no acute distress.  Patient without lightheadedness or dizziness.  Patient neurovascular intact to bilateral lower extremities.  Very small area of wound dehiscence present and oozing blood.  This was discussed with Dr. Harlow Asa with general surgery who advised cleaning the area, applying pressure and reassessing.  Following no active bleeding  Steri-Strip and discharged with follow-up in his office tomorrow morning.  Area was cleaned with sterile saline soaked gauze, light pressure was held and there was no further oozing.  Patient was monitored for approximately 20 minutes without active bleeding.  Steri-Strips were applied and gauze bandage/Tegaderm were placed.  Patient was then monitored for another 20 minutes and ambulated around the department without pain or bleeding.  Vital signs have remained stable throughout emergency department visit and patient requesting discharge home.  Doubt arterial involvement at this time, likely minor skin bleeding present due to minor skin dehiscence.  At this time there does not appear to be any evidence of an acute emergency medical condition and the patient appears stable for discharge with appropriate outpatient follow up. Diagnosis was discussed with patient who verbalizes understanding of care plan and is agreeable to discharge. I have discussed return precautions with patient and husband who verbalize understanding of return precautions. Patient strongly encouraged to follow-up with surgeon's office tomorrow morning. All questions answered.  Patient was seen and evaluated by Dr. Melina Copa during this visit who agrees with plan of care and discharge at this time.  Note: Portions of this report may have been transcribed using voice recognition software. Every effort was made to ensure accuracy; however, inadvertent computerized transcription errors may still be present. Final Clinical Impressions(s) / ED Diagnoses   Final diagnoses:  Wound dehiscence    ED Discharge Orders    None       Gari Crown 10/24/18 2159    Hayden Rasmussen, MD 10/24/18 [phone]

## 2018-12-12 DIAGNOSIS — L570 Actinic keratosis: Secondary | ICD-10-CM | POA: Diagnosis not present

## 2018-12-12 DIAGNOSIS — D1801 Hemangioma of skin and subcutaneous tissue: Secondary | ICD-10-CM | POA: Diagnosis not present

## 2018-12-12 DIAGNOSIS — L814 Other melanin hyperpigmentation: Secondary | ICD-10-CM | POA: Diagnosis not present

## 2018-12-12 DIAGNOSIS — D225 Melanocytic nevi of trunk: Secondary | ICD-10-CM | POA: Diagnosis not present

## 2019-01-17 ENCOUNTER — Telehealth: Payer: Self-pay | Admitting: Medical

## 2019-01-17 NOTE — Telephone Encounter (Signed)
Pt called regarding his wife states she was traveling in [place] and coming through [place] the airport to come home and was concerned with her having the Coronavirus, he wanted to know if she could be tested, Per Vickie she could not be tested since she did not have any symptoms. Gave them the recommend ions  of staying apart for 14 days since he has a a weak immune system,/ for them to sleep in separate beds not drink or eat after one another, pt was ok with these instructions pt was going to inform his wife

## 2019-01-20 ENCOUNTER — Telehealth: Payer: Self-pay | Admitting: Medical

## 2019-01-20 ENCOUNTER — Telehealth: Payer: Self-pay | Admitting: Family Medicine

## 2019-01-20 NOTE — Telephone Encounter (Signed)
Dr Redmond School called and spoke to patient's husband this morning.

## 2019-01-20 NOTE — Telephone Encounter (Signed)
He was in [place] for 1 week.  Presently she is having no symptoms.  She is involved in a conference call right now so I gave information to her husband concerning the fact that since she is symptom-free, she does need to be self quarantine for the next 14 days and if she develops symptoms to let us know so further testing could potentially be done at that point.  Recommended going to the [facility] health website or CDC for more information concerning coronavirus

## 2019-01-20 NOTE — Telephone Encounter (Signed)
Pt husband called and stated that he wanted to take pt to the testing unit tomorrow because she is still running a fever and has traveled to [place] and several other places. pts husband can be reached at [phone]

## 2019-01-20 NOTE — Telephone Encounter (Signed)
I spoke to Dr. Redmond School who spoke to husband this morning.    If she doesn't have cough and SOB, just fever, we recommend using precautions of self quarantine at home for 14 days due to the recent travel.   In general, rest, hydrate well with clear fluids, and if developing worse or new symptoms, then call back.  Regarding testing, if just fever, and no other symptoms and no known Coronavirus exposure, we wouldn't necessary test at this time.   The goal would be to use supportive measures above, self quarantine in the house to minimize exposure to husband and to the public, and call if changes in symptom.  If they are adamant about the test, then let me know and I'll order the test, but you will have to give info on where to go tomorrow for triage and testing.

## 2019-01-20 NOTE — Telephone Encounter (Signed)
Pt called and states she has a cough, feeling tired, slight fever,  She was in [place] all last week and come home through [place] airport Friday night, she has been at home since then, she is wanting to get tested for the virius as she is not feeling well pt can be reached at [phone]

## 2019-01-24 ENCOUNTER — Telehealth: Payer: Self-pay | Admitting: Family Medicine

## 2019-01-24 NOTE — Telephone Encounter (Signed)
Pt called that she's had a low grade fever and felt bad a couple days, had a little cough, no SOB, mild headache and Merry Proud was asking about her being tested.  Spoke with Dr. Redmond School and he advised to let her know that the care doesn't change whether she was positive or not, symptomatic treatment, call if worsens or go to ER if problems with breathing or SOB,  Quarantine still 14 days as discussed before.

## 2019-01-28 NOTE — Telephone Encounter (Signed)
Pt called and states she is feeling better, no fever since Saturday, did have SOB on Saturday. She states she needs to know if she needs to continue quarantine from the start which will be up this Saturday or if quarantine needs to be from 14 days from when the fever stopped? Merry Proud is still concerned about getting the virus and wants to be sure before she comes out of quarantine.

## 2019-01-28 NOTE — Telephone Encounter (Signed)
14 days from the start not fever

## 2019-01-29 NOTE — Telephone Encounter (Signed)
Pt informed quarantine 14 days from start not fever

## 2019-08-05 ENCOUNTER — Encounter: Payer: Self-pay | Admitting: Gynecology

## 2019-08-07 LAB — HM MAMMOGRAPHY

## 2019-08-28 ENCOUNTER — Encounter: Payer: Self-pay | Admitting: Medical

## 2020-01-02 ENCOUNTER — Ambulatory Visit: Payer: 59 | Admitting: Medical

## 2020-01-02 ENCOUNTER — Other Ambulatory Visit: Payer: Self-pay

## 2020-01-02 ENCOUNTER — Encounter: Payer: Self-pay | Admitting: Medical

## 2020-01-02 VITALS — BP 114/72 | HR 64 | Temp 97.6°F | Wt 103.2 lb

## 2020-01-02 DIAGNOSIS — Z113 Encounter for screening for infections with a predominantly sexual mode of transmission: Secondary | ICD-10-CM

## 2020-01-02 DIAGNOSIS — R599 Enlarged lymph nodes, unspecified: Secondary | ICD-10-CM

## 2020-01-02 DIAGNOSIS — Z1211 Encounter for screening for malignant neoplasm of colon: Secondary | ICD-10-CM

## 2020-01-02 DIAGNOSIS — R590 Localized enlarged lymph nodes: Secondary | ICD-10-CM | POA: Insufficient documentation

## 2020-01-02 DIAGNOSIS — Z202 Contact with and (suspected) exposure to infections with a predominantly sexual mode of transmission: Secondary | ICD-10-CM

## 2020-01-02 DIAGNOSIS — Z681 Body mass index (BMI) 19 or less, adult: Secondary | ICD-10-CM

## 2020-01-02 LAB — POCT URINALYSIS DIP (PROADVANTAGE DEVICE)
Blood, UA: NEGATIVE
Glucose, UA: NEGATIVE mg/dL
Leukocytes, UA: NEGATIVE
Nitrite, UA: NEGATIVE
Protein Ur, POC: NEGATIVE mg/dL
Specific Gravity, Urine: 1.025
Urobilinogen, Ur: NEGATIVE
pH, UA: 6 (ref 5.0–8.0)

## 2020-01-02 NOTE — Progress Notes (Signed)
Subjective: Chief Complaint  Patient presents with  . other    acute visit for lump on groin, throbs sometimes been there for about one year   Medical Team:  Dr. Benson Norway, GI Lennie Odor, PA, dermatology Dr. Clayborne Dana, primary care with [facility] Layah Skousen, Camelia Eng, PA-C here for primary care  Last visit here 2018.  In the past 2 years she has gotten a physical and numerous other screening tests free at work through [employer] screenings.   She has had numerous EKG, bone density tests, labs, ultrasound of aorta and carotids.  This was done in conjunction with [employer] [facility] and [facility].  She was under the impression that those records were being forwarded here.   Here for concerns about lymph nodes.  She notes for months or close to a year having hard enlarged, somewhat tender lymph node right inguinal region.   First noticed about 3 months after right inguinal hernia surgery back in 2019.  She just had dermatology screening 2 weeks ago for hx/o melanoma.  No new concerning lesions  Husband has HIV on suppressive therapy and she has remained HIV negative.  They have been careful over the years.  His viral load is suppressed.  She declines HIV preventative medication due to prior success at not contracting this and due to her high risk for osteoporosis.    Of note, she had concern about communication between our office and her back in 2019.  She notes several months of back and forth phone calls about getting referral for hernia surgery and for referral to gynecology that she notes took months to get someone at our office to handle. She eventually saw gynecology and general surgery for the hernia.      ROS as in subjective  Past Medical History:  Diagnosis Date  . Allergic rhinitis   . Femoral hernia of right side   . Hemorrhoids   . Hx of abnormal cervical Pap smear   . Osteopenia 11/2012   repeat in 3-5 years  . Venereal disease  contact    uses condoms, husband is HIV+  . Wears glasses    Current Outpatient Medications on File Prior to Visit  Medication Sig Dispense Refill  . b complex vitamins tablet Take 1 tablet by mouth daily.    Marland Kitchen docusate sodium (COLACE) 100 MG capsule Take 100 mg by mouth daily as needed for mild constipation.    . Multiple Vitamin (MULTIVITAMIN) tablet Take 1 tablet by mouth daily.      . Omega-3 Fatty Acids (FISH OIL PO) Take 1 capsule by mouth daily.     . calcium-vitamin D (OSCAL WITH D) 500-200 MG-UNIT per tablet Take 1 tablet by mouth.    Marland Kitchen HYDROcodone-acetaminophen (NORCO/VICODIN) 5-325 MG tablet Take 1 tablet by mouth every 6 (six) hours as needed for moderate pain. 20 tablet 0   No current facility-administered medications on file prior to visit.      Objective: BP 114/72   Pulse 64   Temp 97.6 F (36.4 C)   Wt 103 lb 3.2 oz (46.8 kg)   LMP 07/28/2011   BMI 16.16 kg/m   Gen: wd, wn, nad, lean thin white female Abdomen: +bs, soft, nontender, no mass, no organomegaly No axillary lymphadenopathy There are several somewhat hard palpable lymph nodes bilateral inguinal region, but particular on the right is an enlarged hard node approx 0.6-1cm diameter , the rest are smaller but firm, , no other obvious palpable  abnormality Exam chaperoned by nurse    Assessment: Encounter Diagnoses  Name Primary?  . Lymphadenopathy, inguinal Yes  . Screen for STD (sexually transmitted disease)   . Enlarged lymph nodes   . Screen for colon cancer   . BMI less than 19,adult   . Venereal disease contact      Plan: Discussed her concerns, findings. Discussed differential.   We will pursue CT, labs as below.   She will call Dr. Ulyses Amor office back to schedule colonoscopy ASAP as she just got mailer from their office.  Reviewed 2019 pap smear from gynecology.     F/u pending labs, scan, colonoscopy soon.  I discussed her concern about communication back in 2019.  Not sure what  happened as there is no documentation in our system about miscommunication, delay in getting referrals, etc.   I will speak to my office manager about this.    Dawnna was seen today for other.  Diagnoses and all orders for this visit:  Lymphadenopathy, inguinal -     CBC with Differential/Platelet -     HIV Antibody (routine testing w rflx) -     POCT Urinalysis DIP (Proadvantage Device) -     Comprehensive metabolic panel  Screen for STD (sexually transmitted disease) -     CBC with Differential/Platelet -     HIV Antibody (routine testing w rflx) -     POCT Urinalysis DIP (Proadvantage Device)  Enlarged lymph nodes -     CT Abdomen Pelvis W Contrast; Future -     Comprehensive metabolic panel  Screen for colon cancer  BMI less than 19,adult  Venereal disease contact

## 2020-01-03 LAB — COMPREHENSIVE METABOLIC PANEL
ALT: 21 IU/L (ref 0–32)
AST: 22 IU/L (ref 0–40)
Albumin/Globulin Ratio: 1.9 (ref 1.2–2.2)
Albumin: 4.9 g/dL (ref 3.8–4.9)
Alkaline Phosphatase: 75 IU/L (ref 39–117)
BUN/Creatinine Ratio: 16 (ref 12–28)
BUN: 15 mg/dL (ref 8–27)
Bilirubin Total: 0.4 mg/dL (ref 0.0–1.2)
CO2: 23 mmol/L (ref 20–29)
Calcium: 9.5 mg/dL (ref 8.7–10.3)
Chloride: 104 mmol/L (ref 96–106)
Creatinine, Ser: 0.94 mg/dL (ref 0.57–1.00)
GFR calc Af Amer: 76 mL/min/{1.73_m2} (ref >59)
GFR calc non Af Amer: 66 mL/min/{1.73_m2} (ref >59)
Globulin, Total: 2.6 g/dL (ref 1.5–4.5)
Glucose: 99 mg/dL (ref 65–99)
Potassium: 4.4 mmol/L (ref 3.5–5.2)
Sodium: 144 mmol/L (ref 134–144)
Total Protein: 7.5 g/dL (ref 6.0–8.5)

## 2020-01-03 LAB — CBC WITH DIFFERENTIAL/PLATELET
Basophils Absolute: 0.1 10*3/uL (ref 0.0–0.2)
Basos: 1 %
EOS (ABSOLUTE): 0.1 10*3/uL (ref 0.0–0.4)
Eos: 1 %
Hematocrit: 43.9 % (ref 34.0–46.6)
Hemoglobin: 15.2 g/dL (ref 11.1–15.9)
Immature Grans (Abs): 0 10*3/uL (ref 0.0–0.1)
Immature Granulocytes: 0 %
Lymphocytes Absolute: 3.8 10*3/uL — ABNORMAL HIGH (ref 0.7–3.1)
Lymphs: 49 %
MCH: 33.3 pg — ABNORMAL HIGH (ref 26.6–33.0)
MCHC: 34.6 g/dL (ref 31.5–35.7)
MCV: 96 fL (ref 79–97)
Monocytes Absolute: 0.4 10*3/uL (ref 0.1–0.9)
Monocytes: 5 %
Neutrophils Absolute: 3.5 10*3/uL (ref 1.4–7.0)
Neutrophils: 44 %
Platelets: 281 10*3/uL (ref 150–450)
RBC: 4.57 x10E6/uL (ref 3.77–5.28)
RDW: 12.4 % (ref 11.7–15.4)
WBC: 7.8 10*3/uL (ref 3.4–10.8)

## 2020-01-03 LAB — HIV ANTIBODY (ROUTINE TESTING W REFLEX): HIV Screen 4th Generation wRfx: NONREACTIVE

## 2020-01-10 ENCOUNTER — Ambulatory Visit: Payer: 59 | Attending: Internal Medicine

## 2020-01-10 DIAGNOSIS — Z23 Encounter for immunization: Secondary | ICD-10-CM

## 2020-01-10 NOTE — Progress Notes (Signed)
   Covid-19 Vaccination Clinic  Name:  Kathryn Myers    MRN: [id number] DOB: 1961-04-15  01/10/2020  [name] was observed post Covid-19 immunization for 15 minutes without incident. She was provided with Vaccine Information Sheet and instruction to access the V-Safe system.   [name] was instructed to call 911 with any severe reactions post vaccine: Marland Kitchen Difficulty breathing  . Swelling of face and throat  . A fast heartbeat  . A bad rash all over body  . Dizziness and weakness   Immunizations Administered    Name Date Dose VIS Date Route   Pfizer COVID-19 Vaccine 01/10/2020 10:48 AM 0.3 mL 10/17/2019 Intramuscular   Manufacturer: [place]   Lot: [id number]   [place]: [id number]

## 2020-01-12 ENCOUNTER — Telehealth: Payer: Self-pay | Admitting: Internal Medicine

## 2020-01-12 ENCOUNTER — Telehealth: Payer: Self-pay

## 2020-01-12 NOTE — Telephone Encounter (Signed)
-----   Message from Patience Musca sent at 01/12/2020 11:10 AM EST ----- Can I get a status on this referral for CT? Thanks, Beverlee Nims

## 2020-01-12 NOTE — Telephone Encounter (Signed)
Patient called on 01/09/20 and stated she got a message from her insurance company stating they did not approve her procedure. She would like to know how to proceed

## 2020-01-12 NOTE — Telephone Encounter (Signed)
CT scan was denied and when I called they said it was denied because their was not enough clinical support without a biopsy of the lymph node to be done first. (not sure where to even send for biopsy)  You can also do a Peer to peer if you feel this should be approved without a biopsy.   Phone number to [facility]- [phone]  Case ID [id number]  CPT- [id number] CT abdomen and pelvis

## 2020-01-13 ENCOUNTER — Other Ambulatory Visit: Payer: Self-pay | Admitting: Medical

## 2020-01-13 DIAGNOSIS — R590 Localized enlarged lymph nodes: Secondary | ICD-10-CM

## 2020-01-13 NOTE — Telephone Encounter (Signed)
If I understand the message correctly is she agreeable then to biopsy?

## 2020-01-13 NOTE — Telephone Encounter (Signed)
Please update patient on status.  At this point after getting 2 different rejections from [place] [employer] (by faxing info over which should have been easier)  for request for CT based on abnormal lymph nodes, I had to call and do a peer to peer with their physician.  Obviously,  this slows down the process to have to jump through all these hoops.  Nevertheless they denied again to do the CT.  Instead they are requiring her next step to be a biopsy of the lymph node since we have already done blood work that didn't point to a cause  So if agreeable, please try to set up an appointment with interventional radiology to do an ultrasound-guided lymph node biopsy of the right inguinal lymph node, possibly other lymph node if needed. (I am assuming this is done through [facility], [place], not sure?  Let me know)     (If for whatever reason she does not want to move forward with biopsy, then refer to gynecology for evaluation)

## 2020-01-13 NOTE — Telephone Encounter (Signed)
Spoke to patient and informed her that she will need a biopsy done per her insurance. Patient is returning call to [place] as they were possibly calling to schedule the biopsy.

## 2020-01-13 NOTE — Telephone Encounter (Signed)
Yes, patient has agreed to do the biopsy

## 2020-01-14 NOTE — Telephone Encounter (Signed)
Spoke to patient and she stated she took her mother to the hospital late in the night. She stated she will call [facility] later today and possibly her insurance and she is thankful for the follow up.

## 2020-01-15 ENCOUNTER — Encounter (HOSPITAL_COMMUNITY): Payer: Self-pay

## 2020-01-15 NOTE — Progress Notes (Signed)
Kathryn Myers Female, 59 y.o., [birth year] MRN:  [id number] Phone:  [phone] Kathryn Myers) PCP:  Kathryn Hurl, PA-C Coverage:  [place] [employer] Other Next Appt With Lab 02/03/2020 at 8:15 AM  RE: Biopsy Received: Today Message Contents  Kathryn Mariscal, MD  Kathryn Myers      US guided right inguinal LN Bx.   Sedation per pt request.   Kathryn Myers   Previous Messages  ----- Message -----  From: Kathryn Myers  Sent: 01/14/2020  2:30 PM EST  To: Kathryn Myers  Subject: Biopsy                      Procedure Requested: [place] FNA    Reason for Procedure: lymphadenopathy inguinal    Provider Requesting: Kathryn Myers  Provider Telephone: [phone]    Other Info: no images, per office need biopsy first in order to have an MRI.

## 2020-01-16 ENCOUNTER — Telehealth (HOSPITAL_COMMUNITY): Payer: Self-pay

## 2020-01-16 ENCOUNTER — Telehealth: Payer: Self-pay | Admitting: Medical

## 2020-01-16 NOTE — Telephone Encounter (Signed)
Message has been sent to patient.

## 2020-01-16 NOTE — Telephone Encounter (Signed)
Please call back in response to her email  Not sure how the wording went to Progress that we ordered a lymph node biopsy not a thyroid biopsy, so not sure why [facility] thought she was doing a thyroid biopsy.  We were told they do the lymph node biopsies  Nevertheless, we have some options.    The one hard area seems like a lymph node but is there the possibility this could be scar tissue?  Yes possibly.  However, there were several other palpable lymph nodes in both inguinal areas.   She is thin so they are easier to see.  That is why I wanted to start with scan but her insurance was being difficult.  At this point we can do 1 of these: 1-we could try for soft tissue ultrasound instead which could still be helpful 2-we could move forward with scheduled fine needle aspiration ultrasound guided biopsy through [facility] 3- she could possibly see gynecology, as they could do ultrasound in house on her pelvic organs and possibly be able to comment on the lymph nodes.     I wished I had a magic ball to say for sure the harder larger right side lesion was just scar tissue, but if feels quite round and node like.

## 2020-01-16 NOTE — Telephone Encounter (Signed)
Message has been sent to provider.

## 2020-01-23 NOTE — Telephone Encounter (Signed)
Are you saying that the imaging order is wrong? If it is, can you please put the right one.

## 2020-01-23 NOTE — Telephone Encounter (Signed)
See prior message.   At this point since insurance is denying CT scan, we can refer to interventional radiology for ultrasound guided biopsy of the lesion/node.   We just had another patient have a neck lymph node biopsy and I spoke to one of the providers that do biopsy personally.   Right inguinal lesion possible lymph node for biopsy   Schedule through:  Interventional radiology specialists -  thyroid nodule biopsy, lymph node biopsy, and other scheduling:  [facility] [place]  [email or web address]

## 2020-01-26 ENCOUNTER — Telehealth: Payer: Self-pay | Admitting: Medical

## 2020-01-26 ENCOUNTER — Other Ambulatory Visit: Payer: Self-pay

## 2020-01-26 DIAGNOSIS — R599 Enlarged lymph nodes, unspecified: Secondary | ICD-10-CM

## 2020-01-26 DIAGNOSIS — R590 Localized enlarged lymph nodes: Secondary | ICD-10-CM

## 2020-01-26 NOTE — Telephone Encounter (Signed)
Not sure how order is suppose to look, but the biopsy is set up through Interventional Radiology with [facility]

## 2020-01-26 NOTE — Telephone Encounter (Signed)
Tried to call Kathryn Myers to give update on status of eval for her nodule.  I left message for her to call back

## 2020-01-27 NOTE — Telephone Encounter (Signed)
Correct order has been placed and patient has been scheduled.

## 2020-01-27 NOTE — Telephone Encounter (Signed)
Patient has been scheduled

## 2020-01-29 ENCOUNTER — Other Ambulatory Visit: Payer: Self-pay | Admitting: Radiology

## 2020-01-30 ENCOUNTER — Ambulatory Visit (HOSPITAL_COMMUNITY)
Admission: RE | Admit: 2020-01-30 | Discharge: 2020-01-30 | Disposition: A | Discharge status: Home / Self Care | Payer: 59 | Source: Ambulatory Visit | Attending: Medical | Admitting: Medical

## 2020-01-30 ENCOUNTER — Other Ambulatory Visit: Payer: Self-pay

## 2020-01-30 DIAGNOSIS — R590 Localized enlarged lymph nodes: Secondary | ICD-10-CM | POA: Diagnosis present

## 2020-01-30 DIAGNOSIS — R599 Enlarged lymph nodes, unspecified: Secondary | ICD-10-CM | POA: Insufficient documentation

## 2020-01-30 MED ORDER — LIDOCAINE HCL (PF) 1 % IJ SOLN
INTRAMUSCULAR | Status: AC
  Filled 2020-01-30: qty 30

## 2020-01-30 NOTE — Procedures (Signed)
  Procedure: [place] core bx R inguinal nodule   EBL:   minimal Complications:  none immediate  See full dictation in [employer].  Dillard Cannon MD Main # [phone] Pager  [phone]

## 2020-02-02 LAB — SURGICAL PATHOLOGY

## 2020-02-03 ENCOUNTER — Ambulatory Visit: Payer: 59 | Attending: Internal Medicine

## 2020-02-03 DIAGNOSIS — Z23 Encounter for immunization: Secondary | ICD-10-CM

## 2020-02-03 NOTE — Progress Notes (Signed)
   Covid-19 Vaccination Clinic  Name:  Kathryn Myers    MRN: [id number] DOB: [birth year]  02/03/2020  Kathryn Myers was observed post Covid-19 immunization for 15 minutes without incident. She was provided with Vaccine Information Sheet and instruction to access the V-Safe system.   Kathryn Myers was instructed to call 911 with any severe reactions post vaccine: Marland Kitchen Difficulty breathing  . Swelling of face and throat  . A fast heartbeat  . A bad rash all over body  . Dizziness and weakness   Immunizations Administered    Name Date Dose VIS Date Route   Pfizer COVID-19 Vaccine 02/03/2020  8:20 AM 0.3 mL 10/17/2019 Intramuscular   Manufacturer: [place]   Lot: [id number]   [place]: [id number]

## 2020-02-05 LAB — HM COLONOSCOPY

## 2020-02-26 ENCOUNTER — Telehealth: Payer: Self-pay | Admitting: Medical

## 2020-02-26 NOTE — Telephone Encounter (Signed)
Received requested records from [facility]

## 2020-03-05 ENCOUNTER — Encounter: Payer: Self-pay | Admitting: Medical

## 2020-03-15 ENCOUNTER — Encounter: Payer: Self-pay | Admitting: Medical

## 2020-03-23 ENCOUNTER — Encounter: Payer: Self-pay | Admitting: Medical

## 2020-03-24 ENCOUNTER — Encounter: Payer: Self-pay | Admitting: Medical

## 2020-03-31 ENCOUNTER — Encounter: Payer: Self-pay | Admitting: Medical

## 2020-08-12 LAB — HM MAMMOGRAPHY

## 2020-10-14 ENCOUNTER — Ambulatory Visit: Payer: 59 | Attending: Internal Medicine

## 2020-10-14 DIAGNOSIS — Z23 Encounter for immunization: Secondary | ICD-10-CM

## 2020-10-14 NOTE — Progress Notes (Signed)
   Covid-19 Vaccination Clinic  Name:  Kathryn Myers    MRN: [id number] DOB: [birth year]  10/14/2020  Kathryn Myers was observed post Covid-19 immunization for 15 minutes without incident. She was provided with Vaccine Information Sheet and instruction to access the V-Safe system.   Kathryn Myers was instructed to call 911 with any severe reactions post vaccine: Marland Kitchen Difficulty breathing  . Swelling of face and throat  . A fast heartbeat  . A bad rash all over body  . Dizziness and weakness   Immunizations Administered    Name Date Dose VIS Date Route   Pfizer COVID-19 Vaccine 10/14/2020  3:43 PM 0.3 mL 08/25/2020 Intramuscular   Manufacturer: [place]   Lot: [id number]   NDC: [address]-4

## 2020-11-08 ENCOUNTER — Encounter: Payer: Self-pay | Admitting: Family Medicine

## 2020-11-08 ENCOUNTER — Encounter: Payer: Self-pay | Admitting: Medical

## 2020-11-08 ENCOUNTER — Other Ambulatory Visit: Payer: Self-pay

## 2020-11-08 ENCOUNTER — Ambulatory Visit: Payer: 59 | Admitting: Family Medicine

## 2020-11-08 VITALS — BP 122/80 | HR 87 | Temp 97.7°F | Wt 101.4 lb

## 2020-11-08 DIAGNOSIS — R0609 Other forms of dyspnea: Secondary | ICD-10-CM

## 2020-11-08 DIAGNOSIS — R0602 Shortness of breath: Secondary | ICD-10-CM | POA: Diagnosis not present

## 2020-11-08 DIAGNOSIS — R0600 Dyspnea, unspecified: Secondary | ICD-10-CM | POA: Diagnosis not present

## 2020-11-08 DIAGNOSIS — R9431 Abnormal electrocardiogram [ECG] [EKG]: Secondary | ICD-10-CM | POA: Diagnosis not present

## 2020-11-08 NOTE — Progress Notes (Signed)
   Subjective:    Patient ID: Kathryn Myers, female    DOB: 1961-06-01, 60 y.o.   MRN: [id number]  HPI She is here for consult concerning an abnormal EKG.  She has been getting EKGs every year from her employment.  The most recent one that was apparently more abnormal than the previous ones.  She also complains of increasing shortness of breath over the last year but especially so in the last 4 months she also complains of some chest discomfort with the shortness of breath.  She states that she does occasionally wake up and has a coughing spell but no shortness of breath.   Review of Systems     Objective:   Physical Exam Alert and in no distress.  Cardiac exam does show a 1/6 SEM heard best at the left sternal border.  Lungs are clear to auscultation. EKG read by me today shows a rate of 68 with a grade 1/6 systolic murmur.  The V leads show a transition zone in V3 however a previous EKG (08/26/20)showed transition zone in V5 which makes me think it was improper lead placement.  The EKG from 2020 shows the transition zone to be V2       Assessment & Plan:  SOB (shortness of breath) - Plan: EKG 12-Lead, CBC with Differential/Platelet, Comprehensive metabolic panel, Lipid panel, Ambulatory referral to Cardiology  Abnormal EKG - Plan: EKG 12-Lead, CBC with Differential/Platelet, Comprehensive metabolic panel, Lipid panel, Ambulatory referral to Cardiology  DOE (dyspnea on exertion)

## 2020-11-09 LAB — CBC WITH DIFFERENTIAL/PLATELET
Basophils Absolute: 0.1 10*3/uL (ref 0.0–0.2)
Basos: 1 %
EOS (ABSOLUTE): 0.2 10*3/uL (ref 0.0–0.4)
Eos: 3 %
Hematocrit: 45.7 % (ref 34.0–46.6)
Hemoglobin: 15.5 g/dL (ref 11.1–15.9)
Immature Grans (Abs): 0 10*3/uL (ref 0.0–0.1)
Immature Granulocytes: 0 %
Lymphocytes Absolute: 2.6 10*3/uL (ref 0.7–3.1)
Lymphs: 42 %
MCH: 32.6 pg (ref 26.6–33.0)
MCHC: 33.9 g/dL (ref 31.5–35.7)
MCV: 96 fL (ref 79–97)
Monocytes Absolute: 0.4 10*3/uL (ref 0.1–0.9)
Monocytes: 6 %
Neutrophils Absolute: 2.9 10*3/uL (ref 1.4–7.0)
Neutrophils: 48 %
Platelets: 272 10*3/uL (ref 150–450)
RBC: 4.75 x10E6/uL (ref 3.77–5.28)
RDW: 12.3 % (ref 11.7–15.4)
WBC: 6.1 10*3/uL (ref 3.4–10.8)

## 2020-11-09 LAB — COMPREHENSIVE METABOLIC PANEL
ALT: 21 IU/L (ref 0–32)
AST: 27 IU/L (ref 0–40)
Albumin/Globulin Ratio: 1.7 (ref 1.2–2.2)
Albumin: 4.9 g/dL — ABNORMAL HIGH (ref 3.8–4.8)
Alkaline Phosphatase: 77 IU/L (ref 44–121)
BUN/Creatinine Ratio: 14 (ref 12–28)
BUN: 13 mg/dL (ref 8–27)
Bilirubin Total: 0.9 mg/dL (ref 0.0–1.2)
CO2: 24 mmol/L (ref 20–29)
Calcium: 9.8 mg/dL (ref 8.7–10.3)
Chloride: 101 mmol/L (ref 96–106)
Creatinine, Ser: 0.91 mg/dL (ref 0.57–1.00)
GFR calc Af Amer: 79 mL/min/{1.73_m2} (ref >59)
GFR calc non Af Amer: 68 mL/min/{1.73_m2} (ref >59)
Globulin, Total: 2.9 g/dL (ref 1.5–4.5)
Glucose: 98 mg/dL (ref 65–99)
Potassium: 4.7 mmol/L (ref 3.5–5.2)
Sodium: 140 mmol/L (ref 134–144)
Total Protein: 7.8 g/dL (ref 6.0–8.5)

## 2020-11-09 LAB — LIPID PANEL
Chol/HDL Ratio: 2.4 ratio (ref 0.0–4.4)
Cholesterol, Total: 261 mg/dL — ABNORMAL HIGH (ref 100–199)
HDL: 109 mg/dL (ref >39)
LDL Chol Calc (NIH): 141 mg/dL — ABNORMAL HIGH (ref 0–99)
Triglycerides: 66 mg/dL (ref 0–149)
VLDL Cholesterol Cal: 11 mg/dL (ref 5–40)

## 2020-11-18 ENCOUNTER — Ambulatory Visit: Payer: 59 | Admitting: Cardiology

## 2020-11-18 ENCOUNTER — Other Ambulatory Visit: Payer: Self-pay

## 2020-11-18 ENCOUNTER — Encounter: Payer: Self-pay | Admitting: Cardiology

## 2020-11-18 VITALS — BP 138/84 | HR 86 | Ht 67.0 in | Wt 102.2 lb

## 2020-11-18 DIAGNOSIS — R9431 Abnormal electrocardiogram [ECG] [EKG]: Secondary | ICD-10-CM

## 2020-11-18 DIAGNOSIS — R0600 Dyspnea, unspecified: Secondary | ICD-10-CM

## 2020-11-18 DIAGNOSIS — Z7189 Other specified counseling: Secondary | ICD-10-CM

## 2020-11-18 DIAGNOSIS — R072 Precordial pain: Secondary | ICD-10-CM

## 2020-11-18 DIAGNOSIS — R0609 Other forms of dyspnea: Secondary | ICD-10-CM

## 2020-11-18 MED ORDER — METOPROLOL TARTRATE 50 MG PO TABS: ORAL_TABLET | ORAL | 0 refills | Status: DC

## 2020-11-18 NOTE — Patient Instructions (Signed)
Testing/Procedures:  Your physician has requested that you have an echocardiogram. Echocardiography is a painless test that uses sound waves to create images of your heart. It provides your doctor with information about the size and shape of your heart and how well your heart's chambers and valves are working. This procedure takes approximately one hour. There are no restrictions for this procedure.[place]  Your cardiac CT will be scheduled at one of the below locations:   [facility] [address] [place], [place] [address] [phone]   If scheduled at [facility], please arrive at the [facility] main entrance of [facility] 30 minutes prior to test start time. Proceed to the [facility] Radiology Department (first floor) to check-in and test prep.   Please follow these instructions carefully (unless otherwise directed):   On the Night Before the Test: . Be sure to Drink plenty of water. . Do not consume any caffeinated/decaffeinated beverages or chocolate 12 hours prior to your test. . Do not take any antihistamines 12 hours prior to your test.   On the Day of the Test: . Drink plenty of water. Do not drink any water within one hour of the test. . Do not eat any food 4 hours prior to the test. . You may take your regular medications prior to the test.  . Take metoprolol (Lopressor) 50 MG two hours prior to test. . HOLD Furosemide/Hydrochlorothiazide morning of the test. . FEMALES- please wear underwire-free bra if available      After the Test: . Drink plenty of water. . After receiving IV contrast, you may experience a mild flushed feeling. This is normal. . On occasion, you may experience a mild rash up to 24 hours after the test. This is not dangerous. If this occurs, you can take Benadryl 25 mg and increase your fluid intake. . If you experience trouble breathing, this can be serious. If it is severe call 911 IMMEDIATELY.  If it is mild, please call our office. . If you take any of these medications: Glipizide/Metformin, Avandament, Glucavance, please do not take 48 hours after completing test unless otherwise instructed.   Once we have confirmed authorization from your insurance company, we will call you to set up a date and time for your test. Based on how quickly your insurance processes prior authorizations requests, please allow up to 4 weeks to be contacted for scheduling your Cardiac CT appointment. Be advised that routine Cardiac CT appointments could be scheduled as many as 8 weeks after your provider has ordered it.  For non-scheduling related questions, please contact the cardiac imaging nurse navigator should you have any questions/concerns: Marchia Bond, Cardiac Imaging Nurse Navigator Burley Saver, Interim Cardiac Imaging Nurse [place] [facility] Services Direct Office Dial: [phone]   For scheduling needs, including cancellations and rescheduling, please call [place], [phone].       Follow-Up: At [facility], you and your health needs are our priority.  As part of our continuing mission to provide you with exceptional heart care, we have created designated Provider Care Teams.  These Care Teams include your primary Cardiologist (physician) and Advanced Practice Providers (APPs -  Physician Assistants and Nurse Practitioners) who all work together to provide you with the care you need, when you need it.  We recommend signing up for the patient portal called "MyChart".  Sign up information is provided on this After Visit Summary.  MyChart is used to connect  with patients for Virtual Visits (Telemedicine).  Patients are able to view lab/test results, encounter notes, upcoming appointments, etc.  Non-urgent messages can be sent to your provider as well.   To learn more about what you can do with MyChart, go to [email or web address].    Your next appointment:    TBD  PENDING TEST RESULTS

## 2020-11-18 NOTE — Progress Notes (Signed)
Cardiology Office Note:    Date:  11/18/2020   ID:  Kathryn Myers, DOB 1961-05-13, MRN [id number]  PCP:  Carlena Hurl, PA-C  Cardiologist:  Buford Dresser, MD  Referring MD: Denita Lung, MD   C: new patient consultation for shortness of breath and abnormal ECG  History of Present Illness:    Kathryn Myers is a 60 y.o. female without prior cardiovascular history who is seen as a new consult at the request of Denita Lung, MD for the evaluation and management of shortness of breath and abnormal ecg.  Note from Dr. Redmond School dated 11/08/20 reviewed. Noted a change in her ECG at her workplace evaluation (done annually) and also reported increasing shortness of breath and chest discomfort. Referred for further evaluation.  ECGs personally reviewed: 08/26/20 sinus bradycardia, late R wave progression/low anterior forces 11/08/20 NSR, normal R wave transition/low anteroseptal forces Scan 11/08/09 (ECG 08/12/2019) sinus bradycardia, normal R wave transition/low anteroseptal forces  Today: Nervous today, concerned about her heart. Recent cholesterol higher than it has been. Has been worried since her abnormal ECG results came back.  Cardiovascular risk factors: Prior clinical ASCVD: none Comorbid conditions, including hypertension, hyperlipidemia, diabetes, chronic kidney disease: none. Nevous today, BP usually low normal. Metabolic syndrome/Obesity: no, BMI 16 Chronic inflammatory conditions: none Tobacco use history: former, quit 1992 Family history: Mat Gma had a stroke. No MI. Father had afib. Mat Gpa had CHF in his 82s. Teena Irani had a pacemaker around age 22. Prior cardiac testing and/or incidental findings on other testing (ie coronary calcium): none Exercise level: very active in the summer, works in the yard. Less exercise since Covid/the winter. Has been working from home for two years Current diet: other than the holidays, eats well. Cereal/oatmeal in the AM,  yogurt/banana at lunch, full dinner. Lots of soup, occasional red meat. No salt.  Shortness of breath: has been getting out of breath after vacuuming the entire house. Has been progressive over the last year. Thinks she may have had Covid in early 2020, breathing improved, then worsened again over the last 3-4 months. Noticed going up/down 3 flights of steps, didn't bother her last year that she remembers but felt more short of breath this year. Rarely also notices shortness of breath after showering.  Chest discomfort: Has had rare, pinpoint fleeting chest pain, goes away in seconds for some time. More recently, noted occasional chest pressure throughout her chest. Last Sunday, felt like her upper abdomen was fluttering, went away on its own.   Denies chest pain, shortness of breath at rest. Borderline PND (does wake up on occasion and feels like she has to cough), orthopnea (went from stomach sleeping to 2 pillows), no LE edema or unexpected weight gain. No syncope.  Past Medical History:  Diagnosis Date  . Allergic rhinitis   . Femoral hernia of right side   . Hemorrhoids   . Hx of abnormal cervical Pap smear   . Osteopenia 11/2012   repeat in 3-5 years  . Venereal disease contact    uses condoms, husband is HIV+  . Wears glasses     Past Surgical History:  Procedure Laterality Date  . COLONOSCOPY  12/2009   Dr. Benson Norway; sessile polyp, medium hemorrhoids, diverticulum  . FEMORAL HERNIA REPAIR Right 10/22/2018   Procedure: RIGHT FEMORAL HERNIA REPAIR WITH MESH;  Surgeon: Donnie Mesa, MD;  Location: [place];  Service: General;  Laterality: Right;  . INGUINAL HERNIA REPAIR  2008  left  . INSERTION OF MESH Right 10/22/2018   Procedure: INSERTION OF MESH;  Surgeon: Donnie Mesa, MD;  Location: [facility];  Service: General;  Laterality: Right;    Current Medications: Current Outpatient Medications on File Prior to Visit  Medication Sig  . Calcium  Carbonate-Vitamin D 600-200 MG-UNIT TABS   . docusate sodium (COLACE) 100 MG capsule Take 100 mg by mouth daily as needed for mild constipation.  . Multiple Vitamin (MULTIVITAMIN ADULT PO)   . Omega-3 Fatty Acids (SALMON OIL PO)    No current facility-administered medications on file prior to visit.     Allergies:   Patient has no known allergies.   Social History   Tobacco Use  . Smoking status: Former Smoker    Packs/day: 0.25    Years: 10.00    Pack years: 2.50    Quit date: 10/27/1991    Years since quitting: 29.0  . Smokeless tobacco: Never Used  Vaping Use  . Vaping Use: Never used  Substance Use Topics  . Alcohol use: Yes    Alcohol/week: 7.0 standard drinks    Types: 7 Glasses of wine per week    Comment: social  . Drug use: No    Family History: family history includes Breast cancer (age of onset: 58) in her maternal aunt; Cancer in her brother and paternal uncle; Heart disease in her father and maternal grandfather; Hyperlipidemia in her father and mother; Hypertension in her father; Osteoporosis in her maternal grandmother and mother; Parkinsonism in her father. There is no history of Stroke or Diabetes.  ROS:   Please see the history of present illness.  Additional pertinent ROS: Constitutional: Negative for chills, fever, night sweats, unintentional weight loss  HENT: Negative for ear pain and hearing loss.   Eyes: Negative for loss of vision and eye pain.  Respiratory: Negative for cough, sputum, wheezing.   Cardiovascular: See HPI. Gastrointestinal: Negative for abdominal pain, melena, and hematochezia.  Genitourinary: Negative for dysuria and hematuria.  Musculoskeletal: Negative for falls and myalgias.  Skin: Negative for itching and rash.  Neurological: Negative for focal weakness, focal sensory changes and loss of consciousness.  Endo/Heme/Allergies: Does not bruise/bleed easily.     EKGs/Labs/Other Studies Reviewed:    The following studies were  reviewed today: No prior cardiac studies  EKG:  EKG is personally reviewed.  The ekg ordered today demonstrates normal sinus rhythm, no R waves in V1-V2 similar to prior, normal R wave progression  Recent Labs: 11/08/2020: ALT 21; BUN 13; Creatinine, Ser 0.91; Hemoglobin 15.5; Platelets 272; Potassium 4.7; Sodium 140  Recent Lipid Panel    Component Value Date/Time   CHOL 261 (H) 11/08/2020 1234   TRIG 66 11/08/2020 1234   HDL 109 11/08/2020 1234   CHOLHDL 2.4 11/08/2020 1234   CHOLHDL 2.3 09/24/2017 0910   VLDL 14 10/25/2015 0001   LDLCALC 141 (H) 11/08/2020 1234   LDLCALC 109 (H) 09/24/2017 0910    Physical Exam:    VS:  BP 138/84   Pulse 86   Ht '5\' 7"'  (1.702 m)   Wt 102 lb 3.2 oz (46.4 kg)   LMP 07/28/2011   SpO2 100%   BMI 16.01 kg/m     Wt Readings from Last 3 Encounters:  11/18/20 102 lb 3.2 oz (46.4 kg)  11/08/20 101 lb 6.4 oz (46 kg)  01/02/20 103 lb 3.2 oz (46.8 kg)    GEN: Well nourished, well developed in no acute distress HEENT: Normal, moist  mucous membranes NECK: No JVD CARDIAC: regular rhythm, normal S1 and S2, no rubs or gallops. No murmur. VASCULAR: Radial and DP pulses 2+ bilaterally. No carotid bruits RESPIRATORY:  Clear to auscultation without rales, wheezing or rhonchi  ABDOMEN: Soft, non-tender, non-distended MUSCULOSKELETAL:  Ambulates independently SKIN: Warm and dry, no edema NEUROLOGIC:  Alert and oriented x 3. No focal neuro deficits noted. PSYCHIATRIC:  Normal affect    ASSESSMENT:    1. Precordial pain   2. DOE (dyspnea on exertion)   3. Abnormal ECG   4. Cardiac risk counseling   5. Counseling on health promotion and disease prevention    PLAN:    Chest discomfort Dyspnea on exertion Abnormal ECG -discussed treadmill stress, nuclear stress/lexiscan, and CT coronary angiography. Discussed pros and cons of each, including but not limited to false positive/false negative risk, radiation risk, and risk of IV contrast dye. Based on  shared decision making, decision was made to pursue CT coronary angiography. -will give one time dose of metoprolol 2 hours prior to scheduled test -counseled on use of sublingual nitroglycerin and its importance to a good test -will get echocardiogram to rule out structural heart disease as cause of her shortness of breath -reviewed ECG together today. Her pattern has been lack of R wave in V1-V2. While the computer reads this as septal infarct, this has been her consistent pattern. Will evaluate with echo/CT as above -counseled on red flag warning signs that need immediate medical attention  Cardiac risk counseling and prevention recommendations: -recommend heart healthy/Mediterranean diet, with whole grains, fruits, vegetable, fish, lean meats, nuts, and olive oil. Limit salt. -recommend moderate walking, 3-5 times/week for 30-50 minutes each session. Aim for at least 150 minutes.week. Goal should be pace of 3 miles/hours, or walking 1.5 miles in 30 minutes -recommend avoidance of tobacco products. Avoid excess alcohol. -ASCVD risk score: The ASCVD Risk score Mikey Bussing DC Jr., et al., 2013) failed to calculate for the following reasons:   The valid HDL cholesterol range is 20 to 100 mg/dL    Plan for follow up: if testing unremarkable, can follow up as needed.  Buford Dresser, MD, PhD, [facility]    Medication Adjustments/Labs and Tests Ordered: Current medicines are reviewed at length with the patient today.  Concerns regarding medicines are outlined above.  Orders Placed This Encounter  Procedures  . CT CORONARY MORPH W/CTA COR W/SCORE W/CA W/CM &/OR WO/CM  . CT CORONARY FRACTIONAL FLOW RESERVE DATA PREP  . CT CORONARY FRACTIONAL FLOW RESERVE FLUID ANALYSIS  . ECHOCARDIOGRAM COMPLETE   Meds ordered this encounter  Medications  . metoprolol tartrate (LOPRESSOR) 50 MG tablet    Sig: TAKE 2 HOURS PRIOR TO CT SCAN    Dispense:  1 tablet    Refill:  0     Patient Instructions   Testing/Procedures:  Your physician has requested that you have an echocardiogram. Echocardiography is a painless test that uses sound waves to create images of your heart. It provides your doctor with information about the size and shape of your heart and how well your heart's chambers and valves are working. This procedure takes approximately one hour. There are no restrictions for this procedure.[place]  Your cardiac CT will be scheduled at one of the below locations:   [facility] [address] [place], [place] [address] [phone]   If scheduled at [facility], please arrive at the [facility] main entrance of [facility] 30 minutes prior to test start time. Proceed to the [facility] Radiology Department (first floor) to check-in and test prep.   Please follow these instructions carefully (unless otherwise directed):   On the Night Before the Test: . Be sure to Drink plenty of water. . Do not consume any caffeinated/decaffeinated beverages or chocolate 12 hours prior to your test. . Do not take any antihistamines 12 hours prior to your test.   On the Day of the Test: . Drink plenty of water. Do not drink any water within one hour of the test. . Do not eat any food 4 hours prior to the test. . You may take your regular medications prior to the test.  . Take metoprolol (Lopressor) 50 MG two hours prior to test. . HOLD Furosemide/Hydrochlorothiazide morning of the test. . FEMALES- please wear underwire-free bra if available      After the Test: . Drink plenty of water. . After receiving IV contrast, you may experience a mild flushed feeling. This is normal. . On occasion, you may experience a mild rash up to 24 hours after the test. This is not dangerous. If this occurs, you can take Benadryl 25 mg and increase your fluid intake. . If you experience trouble breathing, this can be serious. If it is  severe call 911 IMMEDIATELY. If it is mild, please call our office. . If you take any of these medications: Glipizide/Metformin, Avandament, Glucavance, please do not take 48 hours after completing test unless otherwise instructed.   Once we have confirmed authorization from your insurance company, we will call you to set up a date and time for your test. Based on how quickly your insurance processes prior authorizations requests, please allow up to 4 weeks to be contacted for scheduling your Cardiac CT appointment. Be advised that routine Cardiac CT appointments could be scheduled as many as 8 weeks after your provider has ordered it.  For non-scheduling related questions, please contact the cardiac imaging nurse navigator should you have any questions/concerns: Marchia Bond, Cardiac Imaging Nurse Navigator Burley Saver, Interim Cardiac Imaging Nurse [facility] Direct Office Dial: [phone]   For scheduling needs, including cancellations and rescheduling, please call [place], [phone].       Follow-Up: At [facility], you and your health needs are our priority.  As part of our continuing mission to provide you with exceptional heart care, we have created designated Provider Care Teams.  These Care Teams include your primary Cardiologist (physician) and Advanced Practice Providers (APPs -  Physician Assistants and Nurse Practitioners) who all work together to provide you with the care you need, when you need it.  We recommend signing up for the patient portal called "MyChart".  Sign up information is provided on this After Visit Summary.  MyChart is used to connect with patients for Virtual Visits (Telemedicine).  Patients are able to view lab/test results, encounter notes, upcoming appointments, etc.  Non-urgent messages can be sent to your provider as well.   To learn more about what you can do with MyChart, go to [email or web address].    Your  next appointment:    TBD PENDING TEST RESULTS   Signed, Buford Dresser, MD PhD 11/18/2020 11:07 AM    [place]

## 2020-11-29 NOTE — Addendum Note (Signed)
Addended by: Wonda Horner on: 11/29/2020 04:39 PM   Modules accepted: Orders

## 2020-12-06 ENCOUNTER — Other Ambulatory Visit: Payer: Self-pay

## 2020-12-06 ENCOUNTER — Telehealth (HOSPITAL_COMMUNITY): Payer: Self-pay | Admitting: Emergency Medicine

## 2020-12-06 ENCOUNTER — Ambulatory Visit (HOSPITAL_BASED_OUTPATIENT_CLINIC_OR_DEPARTMENT_OTHER): Payer: 59

## 2020-12-06 DIAGNOSIS — R072 Precordial pain: Secondary | ICD-10-CM | POA: Diagnosis not present

## 2020-12-06 DIAGNOSIS — R0600 Dyspnea, unspecified: Secondary | ICD-10-CM

## 2020-12-06 DIAGNOSIS — R0609 Other forms of dyspnea: Secondary | ICD-10-CM

## 2020-12-06 LAB — ECHOCARDIOGRAM COMPLETE
Area-P 1/2: 2.66 cm2
S' Lateral: 2.2 cm

## 2020-12-06 NOTE — Telephone Encounter (Signed)
Reaching out to patient to offer assistance regarding upcoming cardiac imaging study; pt verbalizes understanding of appt date/time, parking situation and where to check in, pre-test NPO status and medications ordered, and verified current allergies; name and call back number provided for further questions should they arise Tavarius Grewe RN Navigator Cardiac Imaging [facility] [phone] office [phone] cell 

## 2020-12-07 ENCOUNTER — Encounter: Payer: 59 | Admitting: *Deleted

## 2020-12-07 ENCOUNTER — Ambulatory Visit (HOSPITAL_COMMUNITY)
Admission: RE | Admit: 2020-12-07 | Discharge: 2020-12-07 | Disposition: A | Discharge status: Home / Self Care | Payer: 59 | Source: Ambulatory Visit | Attending: Cardiology | Admitting: Cardiology

## 2020-12-07 ENCOUNTER — Encounter (HOSPITAL_COMMUNITY): Payer: Self-pay

## 2020-12-07 DIAGNOSIS — R072 Precordial pain: Secondary | ICD-10-CM | POA: Insufficient documentation

## 2020-12-07 DIAGNOSIS — R0600 Dyspnea, unspecified: Secondary | ICD-10-CM | POA: Insufficient documentation

## 2020-12-07 DIAGNOSIS — R0609 Other forms of dyspnea: Secondary | ICD-10-CM

## 2020-12-07 DIAGNOSIS — Z006 Encounter for examination for normal comparison and control in clinical research program: Secondary | ICD-10-CM

## 2020-12-07 MED ORDER — NITROGLYCERIN 0.4 MG SL SUBL
SUBLINGUAL_TABLET | SUBLINGUAL | Status: AC
  Administered 2020-12-07: 0.8 mg via SUBLINGUAL
  Filled 2020-12-07: qty 2

## 2020-12-07 MED ORDER — IOHEXOL 350 MG/ML SOLN
80.0000 mL | Freq: Once | INTRAVENOUS | Status: AC | PRN
  Administered 2020-12-07: 80 mL via INTRAVENOUS

## 2020-12-07 MED ORDER — NITROGLYCERIN 0.4 MG SL SUBL: 0.8000 mg | SUBLINGUAL_TABLET | Freq: Once | SUBLINGUAL | Status: AC

## 2020-12-07 NOTE — Progress Notes (Signed)
IDENTIFY Informed Consent                  Subject Name:   Kathryn Myers    Subject met inclusion and exclusion criteria.  The informed consent form, study requirements and expectations were reviewed with the subject and questions and concerns were addressed prior to the signing of the consent form.  The subject verbalized understanding of the trial requirements.  The subject agreed to participate in the IDENTIFY trial and signed the informed consent.  The informed consent was obtained prior to performance of any protocol-specific procedures for the subject.  A copy of the signed informed consent was given to the subject and a copy was placed in the subject's medical record.   Meade Maw, Research Assistant 12/07/2020 13:40PM

## 2021-03-22 ENCOUNTER — Telehealth: Payer: Self-pay

## 2021-03-22 DIAGNOSIS — Z006 Encounter for examination for normal comparison and control in clinical research program: Secondary | ICD-10-CM

## 2021-03-22 NOTE — Telephone Encounter (Signed)
Called patient for 90 day Identify phone call no answer, I left a voicemail stating the intent of the phone call and our call back number to be reached in our department. 

## 2021-04-12 ENCOUNTER — Telehealth: Payer: Self-pay | Admitting: *Deleted

## 2021-04-12 DIAGNOSIS — Z006 Encounter for examination for normal comparison and control in clinical research program: Secondary | ICD-10-CM

## 2021-04-12 NOTE — Telephone Encounter (Signed)
I called patient for 90 day Identify phone call. I left message for patient to call me back or respond to my email.

## 2021-04-18 ENCOUNTER — Telehealth: Payer: Self-pay

## 2021-04-18 DIAGNOSIS — Z006 Encounter for examination for normal comparison and control in clinical research program: Secondary | ICD-10-CM

## 2021-04-18 NOTE — Telephone Encounter (Signed)
Called patient for 90 day Identify phone call no answer, I left a voicemail stating the intent of the phone call and our call back number to be reached in our department. 

## 2021-05-02 ENCOUNTER — Telehealth: Payer: Self-pay | Admitting: *Deleted

## 2021-05-02 DIAGNOSIS — Z006 Encounter for examination for normal comparison and control in clinical research program: Secondary | ICD-10-CM

## 2021-05-02 NOTE — Telephone Encounter (Signed)
IPatient called Kathryn Myers back for 90-day Identify Study. Patient is doing well with no cardiac symptoms. Patient does have cough with sputum especially when she lies down Patient is going to follow-up with primary doctor about cough. I reminded patient we would call in March for 1 year follow-up.

## 2021-06-16 ENCOUNTER — Other Ambulatory Visit: Payer: Self-pay

## 2021-06-16 ENCOUNTER — Ambulatory Visit: Payer: 59 | Admitting: Family Medicine

## 2021-06-16 VITALS — BP 130/88 | HR 68 | Temp 96.3°F | Wt 98.4 lb

## 2021-06-16 DIAGNOSIS — K219 Gastro-esophageal reflux disease without esophagitis: Secondary | ICD-10-CM | POA: Diagnosis not present

## 2021-06-16 NOTE — Patient Instructions (Signed)
Gastroesophageal Reflux Disease, Adult  Gastroesophageal reflux (GER) happens when acid from the stomach flows up into the tube that connects the mouth and the stomach (esophagus). Normally, food travels down the esophagus and stays in the stomach to be digested. With GER, food and stomach acid sometimes move back up into theesophagus. You may have a disease called gastroesophageal reflux disease (GERD) if the reflux: Happens often. Causes frequent or very bad symptoms. Causes problems such as damage to the esophagus. When this happens, the esophagus becomes sore and swollen. Over time, GERD can make small holes (ulcers) in the lining of the esophagus. What are the causes? This condition is caused by a problem with the muscle between the esophagus and the stomach. When this muscle is weak or not normal, it does not close properlyto keep food and acid from coming back up from the stomach. The muscle can be weak because of: Tobacco use. Pregnancy. Having a certain type of hernia (hiatal hernia). Alcohol use. Certain foods and drinks, such as coffee, chocolate, onions, and peppermint. What increases the risk? Being overweight. Having a disease that affects your connective tissue. Taking NSAIDs, such a ibuprofen. What are the signs or symptoms? Heartburn. Difficult or painful swallowing. The feeling of having a lump in the throat. A bitter taste in the mouth. Bad breath. Having a lot of saliva. Having an upset or bloated stomach. Burping. Chest pain. Different conditions can cause chest pain. Make sure you see your doctor if you have chest pain. Shortness of breath or wheezing. A long-term cough or a cough at night. Wearing away of the surface of teeth (tooth enamel). Weight loss. How is this treated? Making changes to your diet. Taking medicine. Having surgery. Treatment will depend on how bad your symptoms are. Follow these instructions at home: Eating and drinking  Follow a  diet as told by your doctor. You may need to avoid foods and drinks such as: Coffee and tea, with or without caffeine. Drinks that contain alcohol. Energy drinks and sports drinks. Bubbly (carbonated) drinks or sodas. Chocolate and cocoa. Peppermint and mint flavorings. Garlic and onions. Horseradish. Spicy and acidic foods. These include peppers, chili powder, curry powder, vinegar, hot sauces, and BBQ sauce. Citrus fruit juices and citrus fruits, such as oranges, lemons, and limes. Tomato-based foods. These include red sauce, chili, salsa, and pizza with red sauce. Fried and fatty foods. These include donuts, french fries, potato chips, and high-fat dressings. High-fat meats. These include hot dogs, rib eye steak, sausage, ham, and bacon. High-fat dairy items, such as whole milk, butter, and cream cheese. Eat small meals often. Avoid eating large meals. Avoid drinking large amounts of liquid with your meals. Avoid eating meals during the 2-3 hours before bedtime. Avoid lying down right after you eat. Do not exercise right after you eat.  Lifestyle  Do not smoke or use any products that contain nicotine or tobacco. If you need help quitting, ask your doctor. Try to lower your stress. If you need help doing this, ask your doctor. If you are overweight, lose an amount of weight that is healthy for you. Ask your doctor about a safe weight loss goal.  General instructions Pay attention to any changes in your symptoms. Take over-the-counter and prescription medicines only as told by your doctor. Do not take aspirin, ibuprofen, or other NSAIDs unless your doctor says it is okay. Wear loose clothes. Do not wear anything tight around your waist. Raise (elevate) the head of your bed about  6 inches (15 cm). You may need to use a wedge to do this. Avoid bending over if this makes your symptoms worse. Keep all follow-up visits. Contact a doctor if: You have new symptoms. You lose weight and  you do not know why. You have trouble swallowing or it hurts to swallow. You have wheezing or a cough that keeps happening. You have a hoarse voice. Your symptoms do not get better with treatment. Get help right away if: You have sudden pain in your arms, neck, jaw, teeth, or back. You suddenly feel sweaty, dizzy, or light-headed. You have chest pain or shortness of breath. You vomit and the vomit is green, yellow, or black, or it looks like blood or coffee grounds. You faint. Your poop (stool) is red, bloody, or black. You cannot swallow, drink, or eat. These symptoms may represent a serious problem that is an emergency. Do not wait to see if the symptoms will go away. Get medical help right away. Call your local emergency services (911 in the [place] Do not drive yourself to the hospital. Summary If a person has gastroesophageal reflux disease (GERD), food and stomach acid move back up into the esophagus and cause symptoms or problems such as damage to the esophagus. Treatment will depend on how bad your symptoms are. Follow a diet as told by your doctor. Take all medicines only as told by your doctor. This information is not intended to replace advice given to you by your health care provider. Make sure you discuss any questions you have with your healthcare provider. No eating within 2 hours of bedtime and pay attention to particular foods that might make it worse.  Chocolate and licorice are notorious for doing this. Take 2 Prilosec or Nexium daily for the next 2 weeks and then see if we can knock this out and if it does then you can then take it as needed after that Document Revised: 05/03/2020 Document Reviewed: 05/03/2020 Elsevier Patient Education  [place].

## 2021-06-16 NOTE — Progress Notes (Signed)
   Subjective:    Patient ID: Kathryn Myers, female    DOB: 10-29-1961, 60 y.o.   MRN: [id number]  HPI She complains of a several month history of intermittent nighttime coughing.  No fever, chills, sore throat.  She cannot relate this to any particular foods but is not thought of that.  No underlying allergies.  She does not smoke.   Review of Systems     Objective:   Physical Exam Alert and in no distress. Tympanic membranes and canals are normal. Pharyngeal area is normal. Neck is supple without adenopathy or thyromegaly. Cardiac exam shows a regular sinus rhythm without murmurs or gallops. Lungs are clear to auscultation.        Assessment & Plan:  Gastroesophageal reflux disease, unspecified whether esophagitis present I explained that I thought her symptoms were reflux related.  Information was given concerning GERD.  Recommend to Prilosec daily for the next several weeks and and potentially backing off to the minimum effective dose and if continued difficulty further evaluation occurring referral will be made.

## 2021-08-18 LAB — HM MAMMOGRAPHY

## 2021-10-20 HISTORY — PX: SKIN BIOPSY: SHX1

## 2022-03-31 IMAGING — CT CT HEART MORP W/ CTA COR W/ SCORE W/ CA W/CM &/OR W/O CM
4 of 7 series · 8 of 20 positions shown, 9 images · IV contrast (APPLIED)
Comparison: None.
COMPARISON: None.

Addendum:
EXAM:
OVER-READ INTERPRETATION  CT CHEST

The following report is an over-read performed by radiologist Dr.
Caleb Aujla [[email or web address]] on 12/07/2020. This over-read
does not include interpretation of cardiac or coronary anatomy or
pathology. The coronary CTA interpretation by the cardiologist is
attached.
HISTORY: Pericardial disease suspected
Cardiac/Coronary CT
TECHNIQUE: The patient was scanned on a Siemens Force scanner.
PROTOCOL: A 110 kV prospective scan was triggered in the descending thoracic
aorta at 111 HU's. Axial non-contrast 3 mm slices were carried out
through the heart. The data set was analyzed on a dedicated work
station and scored using the Agatson method. Gantry rotation speed
was 250 msecs and collimation was 0.6 mm. Beta blockade and 0.8 mg
of sl NTG was given. The 3D data set was reconstructed in 5%
intervals of 35-75% of the R-R cycle. Diastolic phases were analyzed
on a dedicated work station using MPR, MIP and VRT modes. The
patient received 80mL OMNIPAQUE IOHEXOL 350 MG/ML SOLN of contrast.

[Series 6: best diast 72 % · axial · 0.39mm/px · z∈[-331,-282]mm · 2 of 368 slices shown, 3 images]
[im 123/368  vessel]
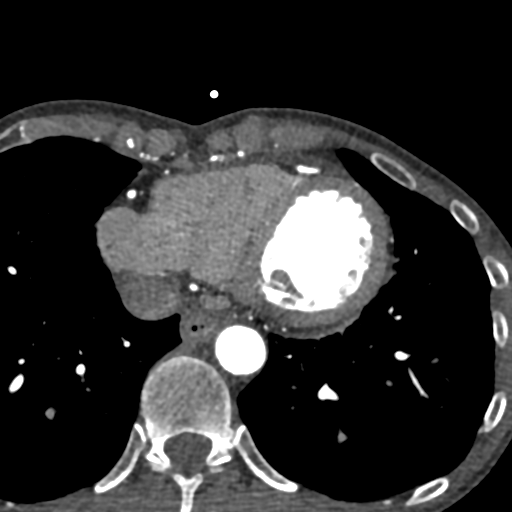
[im 123/368  lung]
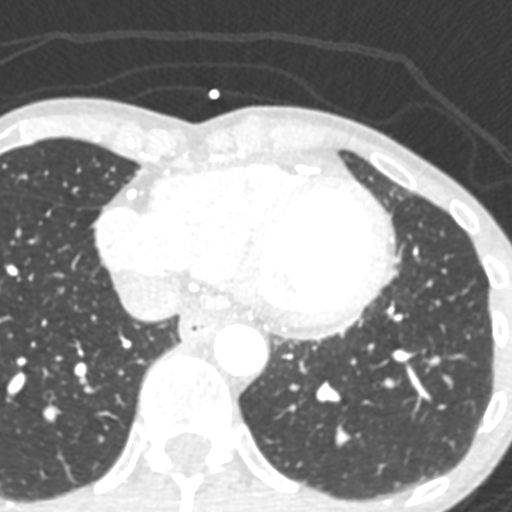
[im 245/368  vessel]
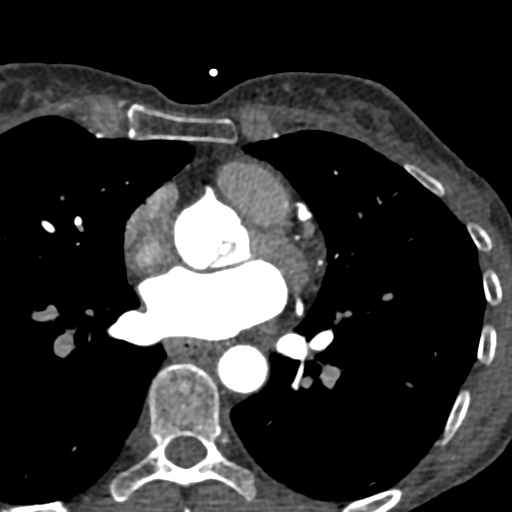

[Series 7: best syst 38 % · axial · 0.39mm/px · z∈[-331,-282]mm · 2 of 368 slices shown]
[im 123/368  vessel]
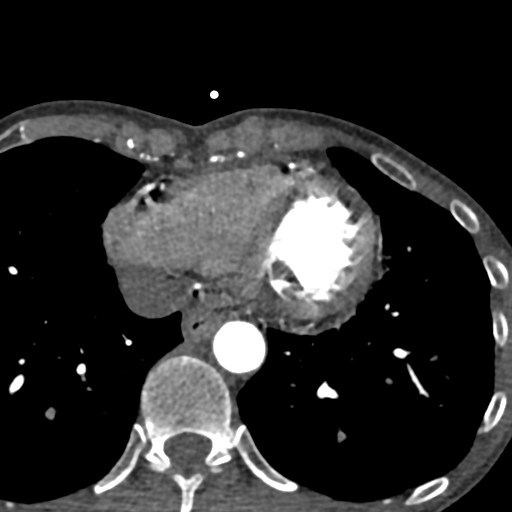
[im 245/368  vessel]
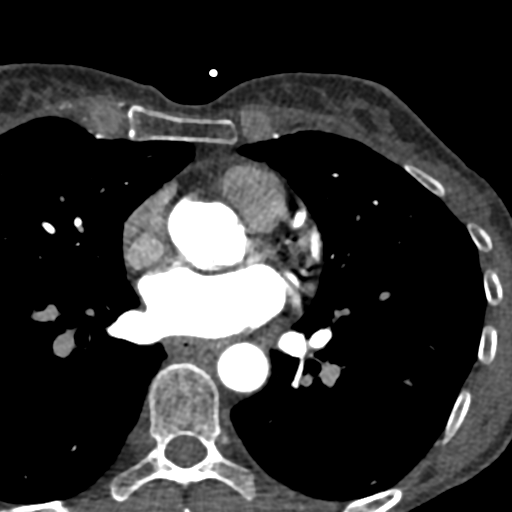

[Series 8: ts diast sharp 72 % · axial · 0.39mm/px · z∈[-331,-282]mm · 2 of 368 slices shown]
[im 123/368  lung]
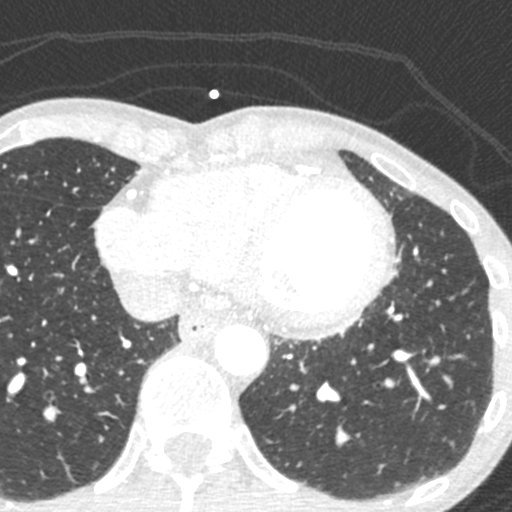
[im 245/368  lung]
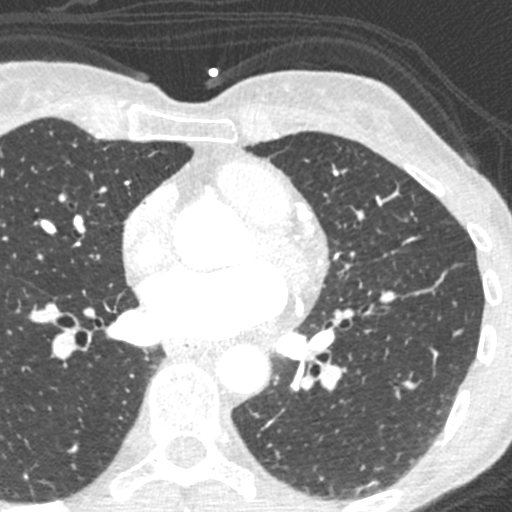

[Series 9: ts syst sharp 38 % · axial · 0.39mm/px · z∈[-331,-282]mm · 2 of 368 slices shown]
[im 123/368  lung]
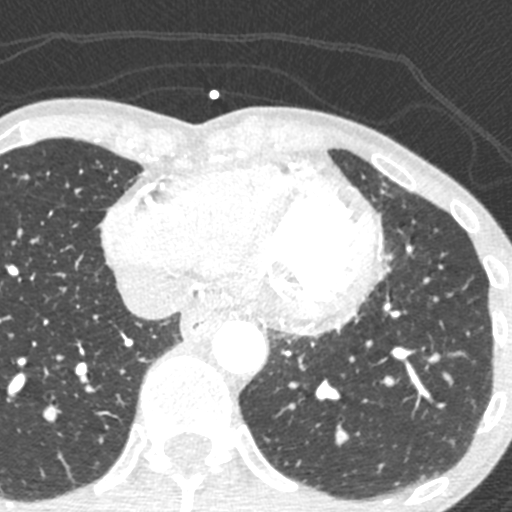
[im 245/368  lung]
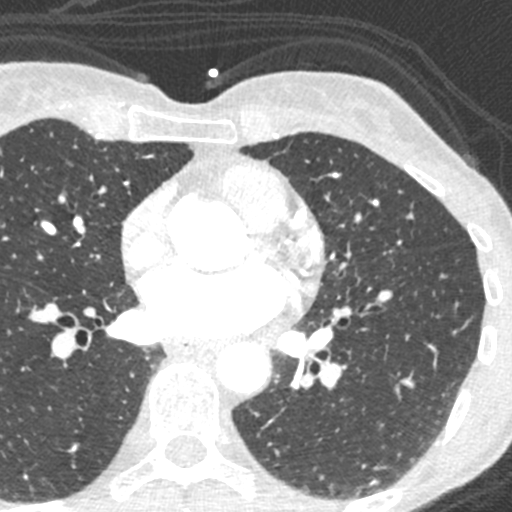

[8 of 20 positions shown; findings below may reference images not displayed]

FINDINGS: Vascular: Aortic atherosclerosis. No central pulmonary embolism, on
this non-dedicated study.

Mediastinum/Nodes: No imaged thoracic adenopathy.

Lungs/Pleura: No pleural fluid.  Clear imaged lungs.

Upper Abdomen: High right hepatic lobe 2.3 cm cyst or minimally
complex cyst. Normal imaged portions of the spleen, stomach, left
kidney.

Musculoskeletal: No acute osseous abnormality.
IMPRESSION: 1.  No acute findings in the imaged extracardiac chest.
2.  Aortic Atherosclerosis (O0RLL-124.4).
FINDINGS: Coronary calcium score: The patient's coronary artery calcium score
is 0, which places the patient in the 0 percentile.

Coronary arteries: Normal coronary origins.  Right dominance.

Right Coronary Artery: Normal caliber vessel, gives rise to PDA. No
significant plaque or stenosis.

Left Main Coronary Artery: Normal caliber vessel. No significant
plaque or stenosis.

Left Anterior Descending Coronary Artery: Normal caliber vessel. No
significant plaque or stenosis. Gives rise to 2 large diagonal
branches. Distal LAD wraps apex.

Left Circumflex Artery: Normal caliber vessel. No significant plaque
or stenosis. Gives rise to 3 OM branches.

Aorta: Normal size, 26 mm at the mid ascending aorta (level of the
PA bifurcation) measured double oblique. No aortic calcification
appreciated. No dissection.

Aortic Valve: No calcifications. Trileaflet.

Other findings:

Normal pulmonary vein drainage into the left atrium. There is a
common left pulmonary vein ostium, normal variant.

Normal left atrial appendage without a thrombus.

Normal size of the pulmonary artery.
IMPRESSION: 1. No evidence of CAD, CADRADS = 0.

2. Coronary calcium score of 0. This was 0 percentile for age and
sex matched control.

3. Normal coronary origin with right dominance.

*** End of Addendum ***
EXAM:
OVER-READ INTERPRETATION  CT CHEST

The following report is an over-read performed by radiologist Dr.
Caleb Aujla [[email or web address]] on 12/07/2020. This over-read
does not include interpretation of cardiac or coronary anatomy or
pathology. The coronary CTA interpretation by the cardiologist is
attached.
FINDINGS: Vascular: Aortic atherosclerosis. No central pulmonary embolism, on
this non-dedicated study.

Mediastinum/Nodes: No imaged thoracic adenopathy.

Lungs/Pleura: No pleural fluid.  Clear imaged lungs.

Upper Abdomen: High right hepatic lobe 2.3 cm cyst or minimally
complex cyst. Normal imaged portions of the spleen, stomach, left
kidney.

Musculoskeletal: No acute osseous abnormality.
IMPRESSION: 1.  No acute findings in the imaged extracardiac chest.
2.  Aortic Atherosclerosis (O0RLL-124.4).

## 2022-07-12 ENCOUNTER — Encounter: Payer: Self-pay | Admitting: Internal Medicine

## 2022-08-08 ENCOUNTER — Ambulatory Visit: Payer: 59 | Admitting: Family Medicine

## 2022-08-08 ENCOUNTER — Encounter: Payer: Self-pay | Admitting: Family Medicine

## 2022-08-08 VITALS — BP 128/88 | HR 48 | Temp 96.8°F | Wt 97.0 lb

## 2022-08-08 DIAGNOSIS — Z23 Encounter for immunization: Secondary | ICD-10-CM | POA: Diagnosis not present

## 2022-08-08 DIAGNOSIS — K219 Gastro-esophageal reflux disease without esophagitis: Secondary | ICD-10-CM

## 2022-08-08 MED ORDER — PANTOPRAZOLE SODIUM 40 MG PO TBEC: 40.0000 mg | DELAYED_RELEASE_TABLET | Freq: Two times a day (BID) | ORAL | 0 refills | Status: DC

## 2022-08-08 NOTE — Progress Notes (Signed)
   Subjective:    Patient ID: Kathryn Myers, female    DOB: 1961-01-09, 61 y.o.   MRN: [id number]  HPI She is here for consult concerning reflux disease.  She has a previous history of difficulty with this but especially has gotten worse in the last several months and is now noted hoarse voice and occasional wheezing and coughing because of the reflux.  She has taken Nexium double dose recently and noted no improvement of her symptoms.  She cannot relate this to any particular eating habits.   Review of Systems     Objective:   Physical Exam Alert and in no distress.  Throat is clear.  Neck is supple without adenopathy.  Cardiac and lung exam normal.       Assessment & Plan:  Gastroesophageal reflux disease, unspecified whether esophagitis present - Plan: pantoprazole (PROTONIX) 40 MG tablet  Need for influenza vaccination - Plan: Flu Vaccine QUAD 6+ mos PF IM (Fluarix Quad PF) I will have her take double dose of Protonix for the next month to see how that will do.  If there is improvement she is to then cut back to daily dosing and then every other day as she tolerates.  If she has no improvement, I will refer to GI for further evaluation.  She was comfortable with that.

## 2022-08-15 ENCOUNTER — Encounter: Payer: Self-pay | Admitting: Internal Medicine

## 2022-08-28 ENCOUNTER — Encounter: Payer: Self-pay | Admitting: Internal Medicine

## 2022-09-04 ENCOUNTER — Telehealth: Payer: Self-pay | Admitting: Family Medicine

## 2022-09-04 DIAGNOSIS — K219 Gastro-esophageal reflux disease without esophagitis: Secondary | ICD-10-CM

## 2022-09-04 MED ORDER — PANTOPRAZOLE SODIUM 40 MG PO TBEC: 40.0000 mg | DELAYED_RELEASE_TABLET | Freq: Two times a day (BID) | ORAL | 0 refills | Status: DC

## 2022-09-04 NOTE — Telephone Encounter (Signed)
Pt requesting refill on pantoprazole, she said she will be out this Friday to [id number] [place] [address] [place] [id number]

## 2022-09-05 ENCOUNTER — Other Ambulatory Visit: Payer: Self-pay | Admitting: Family Medicine

## 2022-09-05 DIAGNOSIS — K219 Gastro-esophageal reflux disease without esophagitis: Secondary | ICD-10-CM

## 2022-09-05 LAB — HM MAMMOGRAPHY

## 2022-09-06 ENCOUNTER — Ambulatory Visit: Payer: 59 | Admitting: Family Medicine

## 2022-09-06 ENCOUNTER — Encounter: Payer: Self-pay | Admitting: Family Medicine

## 2022-09-06 VITALS — BP 108/70 | HR 68 | Ht 67.0 in | Wt 98.0 lb

## 2022-09-06 DIAGNOSIS — K219 Gastro-esophageal reflux disease without esophagitis: Secondary | ICD-10-CM

## 2022-09-06 DIAGNOSIS — J4521 Mild intermittent asthma with (acute) exacerbation: Secondary | ICD-10-CM | POA: Diagnosis not present

## 2022-09-06 DIAGNOSIS — Z23 Encounter for immunization: Secondary | ICD-10-CM

## 2022-09-06 DIAGNOSIS — J302 Other seasonal allergic rhinitis: Secondary | ICD-10-CM

## 2022-09-06 MED ORDER — ALBUTEROL SULFATE HFA 108 (90 BASE) MCG/ACT IN AERS: 2.0000 | INHALATION_SPRAY | Freq: Four times a day (QID) | RESPIRATORY_TRACT | 0 refills | Status: DC | PRN

## 2022-09-06 MED ORDER — PANTOPRAZOLE SODIUM 40 MG PO TBEC: 40.0000 mg | DELAYED_RELEASE_TABLET | Freq: Two times a day (BID) | ORAL | 2 refills | Status: DC

## 2022-09-06 NOTE — Progress Notes (Signed)
   Subjective:    Patient ID: Kathryn Myers, female    DOB: [birth year], 61 y.o.   MRN: [id number]  HPI She is here for recheck.  She states that for the reflux symptoms are much better having much less coughing and sleeping better.  She does have underlying allergies that usually bother her in the spring but she states that this fall she has had difficulty with nasal congestion and rhinorrhea and had 1 episode of an asthma attack.  Her husband gave her albuterol which really helped.   Review of Systems     Objective:   Physical Exam Alert and in no distress.  TMs normal.  Neck is supple without adenopathy.  Nasal mucosa normal.  Throat clear.       Assessment & Plan:  Gastroesophageal reflux disease, unspecified whether esophagitis present - Plan: pantoprazole (PROTONIX) 40 MG tablet  Mild intermittent asthma with exacerbation - Plan: albuterol (VENTOLIN HFA) 108 (90 Base) MCG/ACT inhaler  Seasonal allergic rhinitis, unspecified trigger She will continue on Protonix twice per day, Ventolin given in case she has another asthma attack.  She is also to more aggressively treat her allergies with Rhinocort as well as either Claritin or Allegra regularly and I will recheck her in 1 month.

## 2022-09-06 NOTE — Patient Instructions (Signed)
Use Rhinocort nasal spray and also take either Claritin or Allegra.  Use the Proventil inhaler as needed. Stay on the double dose of Protonix and let me see you again in a month

## 2022-09-08 ENCOUNTER — Telehealth: Payer: Self-pay | Admitting: Family Medicine

## 2022-09-08 NOTE — Telephone Encounter (Signed)
Recv'd fax need P.A. albuterol, called pharmacy & changed to preferred covered  medication

## 2022-09-11 ENCOUNTER — Encounter: Payer: Self-pay | Admitting: Family Medicine

## 2022-09-12 ENCOUNTER — Encounter: Payer: Self-pay | Admitting: Family Medicine

## 2022-10-05 ENCOUNTER — Other Ambulatory Visit: Payer: Self-pay | Admitting: Family Medicine

## 2022-10-05 DIAGNOSIS — J4521 Mild intermittent asthma with (acute) exacerbation: Secondary | ICD-10-CM

## 2022-10-09 ENCOUNTER — Encounter: Payer: Self-pay | Admitting: Family Medicine

## 2022-10-09 ENCOUNTER — Ambulatory Visit: Payer: 59 | Admitting: Family Medicine

## 2022-10-09 VITALS — BP 130/82 | HR 62 | Wt 100.4 lb

## 2022-10-09 DIAGNOSIS — Z23 Encounter for immunization: Secondary | ICD-10-CM | POA: Diagnosis not present

## 2022-10-09 DIAGNOSIS — K219 Gastro-esophageal reflux disease without esophagitis: Secondary | ICD-10-CM

## 2022-10-09 DIAGNOSIS — J4521 Mild intermittent asthma with (acute) exacerbation: Secondary | ICD-10-CM | POA: Diagnosis not present

## 2022-10-09 NOTE — Progress Notes (Signed)
   Subjective:    Patient ID: Kathryn Myers, female    DOB: Jul 04, 1961, 62 y.o.   MRN: [id number]  HPI She is here for recheck.  She is still having difficulty with cough, nasal congestion, postnasal drainage as well as hoarse voice.  She apparently also had a asthma attack and needed to use an inhaler.  She has been on pantoprazole 40 mg twice daily and states that she might be 50% better.   Review of Systems     Objective:   Physical Exam Alert and in no distress. Tympanic membranes and canals are normal. Pharyngeal area is normal. Neck is supple without adenopathy or thyromegaly. Cardiac exam shows a regular sinus rhythm without murmurs or gallops. Lungs are clear to auscultation.        Assessment & Plan:  Gastroesophageal reflux disease, unspecified whether esophagitis present - Plan: Ambulatory referral to ENT  Mild intermittent asthma with exacerbation  Need for Tdap vaccination - Plan: Tdap vaccine greater than or equal to 7yo IM Since she has not really responded to maximal dosing on PPI and is having hoarse voice coughing reflux symptoms, I think ENT referral is appropriate.

## 2022-10-16 ENCOUNTER — Telehealth: Payer: Self-pay | Admitting: Family Medicine

## 2022-10-16 NOTE — Telephone Encounter (Signed)
Please call re ENT referral, she has not heard anything

## 2022-10-18 NOTE — Telephone Encounter (Signed)
Patient's husband Kathryn Myers advised (OK per DPR).

## 2022-10-23 ENCOUNTER — Telehealth: Payer: Self-pay | Admitting: Family Medicine

## 2022-10-23 NOTE — Telephone Encounter (Signed)
[facility] called to schedule appointment with ENT that you referred her to but they couldn't get her in until February. So she called their [place] location and they could get her in January 10 th but they need the referral faxed over to the [facility] office. Provided fax number [phone].

## 2022-10-23 NOTE — Telephone Encounter (Signed)
Can you transfer referral for pt to another location

## 2022-11-04 ENCOUNTER — Emergency Department (HOSPITAL_COMMUNITY): Payer: 59

## 2022-11-04 ENCOUNTER — Encounter (HOSPITAL_COMMUNITY): Payer: Self-pay | Admitting: Emergency Medicine

## 2022-11-04 ENCOUNTER — Other Ambulatory Visit: Payer: Self-pay

## 2022-11-04 ENCOUNTER — Inpatient Hospital Stay (HOSPITAL_COMMUNITY)
Admission: EM | Admit: 2022-11-04 | Discharge: 2022-11-07 | DRG: 202 | Disposition: A | Discharge status: Home / Self Care | Payer: 59 | Attending: Internal Medicine | Admitting: Internal Medicine

## 2022-11-04 DIAGNOSIS — Z87891 Personal history of nicotine dependence: Secondary | ICD-10-CM

## 2022-11-04 DIAGNOSIS — Z1152 Encounter for screening for COVID-19: Secondary | ICD-10-CM

## 2022-11-04 DIAGNOSIS — K219 Gastro-esophageal reflux disease without esophagitis: Secondary | ICD-10-CM | POA: Diagnosis present

## 2022-11-04 DIAGNOSIS — E876 Hypokalemia: Secondary | ICD-10-CM | POA: Diagnosis present

## 2022-11-04 DIAGNOSIS — R0603 Acute respiratory distress: Secondary | ICD-10-CM | POA: Diagnosis not present

## 2022-11-04 DIAGNOSIS — J45901 Unspecified asthma with (acute) exacerbation: Secondary | ICD-10-CM | POA: Diagnosis not present

## 2022-11-04 DIAGNOSIS — J309 Allergic rhinitis, unspecified: Secondary | ICD-10-CM | POA: Diagnosis present

## 2022-11-04 DIAGNOSIS — J9601 Acute respiratory failure with hypoxia: Secondary | ICD-10-CM | POA: Diagnosis present

## 2022-11-04 DIAGNOSIS — J4521 Mild intermittent asthma with (acute) exacerbation: Secondary | ICD-10-CM

## 2022-11-04 DIAGNOSIS — J209 Acute bronchitis, unspecified: Secondary | ICD-10-CM | POA: Diagnosis present

## 2022-11-04 DIAGNOSIS — Z79899 Other long term (current) drug therapy: Secondary | ICD-10-CM

## 2022-11-04 DIAGNOSIS — R112 Nausea with vomiting, unspecified: Secondary | ICD-10-CM | POA: Diagnosis present

## 2022-11-04 LAB — CBC WITH DIFFERENTIAL/PLATELET
Abs Immature Granulocytes: 0.03 10*3/uL (ref 0.00–0.07)
Basophils Absolute: 0.1 10*3/uL (ref 0.0–0.1)
Basophils Relative: 1 %
Eosinophils Absolute: 1.1 10*3/uL — ABNORMAL HIGH (ref 0.0–0.5)
Eosinophils Relative: 9 %
HCT: 43.7 % (ref 36.0–46.0)
Hemoglobin: 14.8 g/dL (ref 12.0–15.0)
Immature Granulocytes: 0 %
Lymphocytes Relative: 37 %
Lymphs Abs: 4.3 10*3/uL — ABNORMAL HIGH (ref 0.7–4.0)
MCH: 33.6 pg (ref 26.0–34.0)
MCHC: 33.9 g/dL (ref 30.0–36.0)
MCV: 99.1 fL (ref 80.0–100.0)
Monocytes Absolute: 0.5 10*3/uL (ref 0.1–1.0)
Monocytes Relative: 4 %
Neutro Abs: 5.6 10*3/uL (ref 1.7–7.7)
Neutrophils Relative %: 49 %
Platelets: 285 10*3/uL (ref 150–400)
RBC: 4.41 MIL/uL (ref 3.87–5.11)
RDW: 12.6 % (ref 11.5–15.5)
WBC: 11.6 10*3/uL — ABNORMAL HIGH (ref 4.0–10.5)
nRBC: 0 % (ref 0.0–0.2)

## 2022-11-04 LAB — BLOOD GAS, VENOUS
Acid-base deficit: 2.5 mmol/L — ABNORMAL HIGH (ref 0.0–2.0)
Bicarbonate: 25.3 mmol/L (ref 20.0–28.0)
O2 Saturation: 47.4 %
Patient temperature: 37.1
pCO2, Ven: 55 mmHg (ref 44–60)
pH, Ven: 7.27 (ref 7.25–7.43)
pO2, Ven: 35 mmHg (ref 32–45)

## 2022-11-04 LAB — RESP PANEL BY RT-PCR (RSV, FLU A&B, COVID)  RVPGX2
Influenza A by PCR: NEGATIVE
Influenza B by PCR: NEGATIVE
Resp Syncytial Virus by PCR: NEGATIVE
SARS Coronavirus 2 by RT PCR: NEGATIVE

## 2022-11-04 LAB — D-DIMER, QUANTITATIVE: D-Dimer, Quant: 1.98 ug/mL-FEU — ABNORMAL HIGH (ref 0.00–0.50)

## 2022-11-04 LAB — BRAIN NATRIURETIC PEPTIDE: B Natriuretic Peptide: 43.1 pg/mL (ref 0.0–100.0)

## 2022-11-04 MED ORDER — METHYLPREDNISOLONE SODIUM SUCC 125 MG IJ SOLR
INTRAMUSCULAR | Status: AC
  Filled 2022-11-04: qty 2

## 2022-11-04 MED ORDER — ALBUTEROL SULFATE (2.5 MG/3ML) 0.083% IN NEBU
10.0000 mg/h | INHALATION_SOLUTION | Freq: Once | RESPIRATORY_TRACT | Status: AC
  Administered 2022-11-04: 10 mg/h via RESPIRATORY_TRACT
  Filled 2022-11-04: qty 3

## 2022-11-04 MED ORDER — MAGNESIUM SULFATE 2 GM/50ML IV SOLN
2.0000 g | Freq: Once | INTRAVENOUS | Status: AC
  Administered 2022-11-04: 2 g via INTRAVENOUS
  Filled 2022-11-04: qty 50

## 2022-11-04 MED ORDER — ONDANSETRON HCL 4 MG/2ML IJ SOLN
4.0000 mg | Freq: Once | INTRAMUSCULAR | Status: AC
  Administered 2022-11-04: 4 mg via INTRAVENOUS
  Filled 2022-11-04: qty 2

## 2022-11-04 NOTE — Progress Notes (Signed)
PT placed on bipap per MD tolerating will at this time, PT was initially nauseas, but okay after nausea medication. PT instructed on taking mask off if she needed to vomit.

## 2022-11-04 NOTE — ED Triage Notes (Addendum)
2g mag, 0.3 epi, 2 nebs, '125mg'$  steriod given in route. Cpap stopped due to n/v.  Patient here from home reporting SOB with nausea/vomiting. Hx of asthma, denies COPD.

## 2022-11-04 NOTE — ED Provider Notes (Signed)
[facility] DEPT Provider Note   CSN: [id number] Arrival date & time: 11/04/22  2154     History  Chief Complaint  Patient presents with   Shortness of Breath   Nausea   Emesis    Kathryn Myers is a 61 y.o. female.  HPI   61 year old female with history significant for GERD who presents to the emergency department in respiratory distress.  The patient states that she has no known history of asthma or COPD.  She states that over the last several days she has had worsening shortness of breath with chest tightness.  She denies any chest pain.  She endorses a mild nonproductive cough.  Her breathing worsened tonight and she called EMS where she was found to be diffusely wheezing.  She was placed on CPAP, administered 2 g of magnesium, 0.3 mg of IM epinephrine, 2 albuterol nebulizers and 125 mg of IV Solu-Medrol.  She had an episode of nausea and vomiting in route and CPAP was stopped.  Home Medications Prior to Admission medications   Medication Sig Start Date End Date Taking? Authorizing Provider  albuterol (VENTOLIN HFA) 108 (90 Base) MCG/ACT inhaler Inhale 2 puffs into the lungs every 6 (six) hours as needed for wheezing or shortness of breath. 09/06/22   Denita Lung, MD  B Complex-C (B-COMPLEX WITH VITAMIN C) tablet Take 1 tablet by mouth daily.    [provider]  Calcium Carbonate-Vitamin D 600-200 MG-UNIT TABS     [provider]  docusate sodium (COLACE) 100 MG capsule Take 100 mg by mouth daily as needed for mild constipation.    [provider]  EFUDEX 5 % cream Apply topically 3 (three) times daily as needed. Patient not taking: Reported on 10/09/2022 06/10/21   [provider]  Multiple Vitamin (MULTIVITAMIN ADULT PO)     [provider]  Omega-3 Fatty Acids (SALMON OIL PO)     [provider]  pantoprazole (PROTONIX) 40 MG tablet Take 1 tablet (40 mg total) by mouth 2 (two) times daily.  09/06/22   Denita Lung, MD      Allergies    Patient has no known allergies.    Review of Systems   Review of Systems  Unable to perform ROS: Acuity of condition    Physical Exam Updated Vital Signs BP 126/69   Pulse (!) 110   Temp 97.7 F (36.5 C) (Oral)   Resp (!) 26   LMP 07/28/2011   SpO2 100%  Physical Exam Vitals and nursing note reviewed.  Constitutional:      General: She is not in acute distress.    Appearance: She is well-developed.  HENT:     Head: Normocephalic and atraumatic.  Eyes:     Conjunctiva/sclera: Conjunctivae normal.  Cardiovascular:     Rate and Rhythm: Normal rate and regular rhythm.     Heart sounds: No murmur heard. Pulmonary:     Effort: Tachypnea and respiratory distress present.     Breath sounds: Examination of the right-upper field reveals wheezing. Examination of the left-upper field reveals wheezing. Examination of the right-middle field reveals wheezing. Examination of the left-middle field reveals wheezing. Examination of the right-lower field reveals wheezing. Examination of the left-lower field reveals wheezing. Decreased breath sounds and wheezing present.  Abdominal:     Palpations: Abdomen is soft.     Tenderness: There is no abdominal tenderness.  Musculoskeletal:        General: No swelling.  Cervical back: Neck supple.  Skin:    General: Skin is warm and dry.     Capillary Refill: Capillary refill takes less than 2 seconds.  Neurological:     Mental Status: She is alert.  Psychiatric:        Mood and Affect: Mood normal.     ED Results / Procedures / Treatments   Labs (all labs ordered are listed, but only abnormal results are displayed) Labs Reviewed  BLOOD GAS, VENOUS - Abnormal; Notable for the following components:      Result Value   Acid-base deficit 2.5 (*)    All other components within normal limits  D-DIMER, QUANTITATIVE - Abnormal; Notable for the following components:   D-Dimer, Quant 1.98 (*)     All other components within normal limits  CBC WITH DIFFERENTIAL/PLATELET - Abnormal; Notable for the following components:   WBC 11.6 (*)    Lymphs Abs 4.3 (*)    Eosinophils Absolute 1.1 (*)    All other components within normal limits  RESP PANEL BY RT-PCR (RSV, FLU A&B, COVID)  [id number]  BRAIN NATRIURETIC PEPTIDE  COMPREHENSIVE METABOLIC PANEL  TROPONIN I (HIGH SENSITIVITY)  TROPONIN I (HIGH SENSITIVITY)    EKG EKG Interpretation  Date/Time:  Saturday November 04 2022 22:25:15 EST Ventricular Rate:  99 PR Interval:  162 QRS Duration: 89 QT Interval:  354 QTC Calculation: 455 R Axis:   57 Text Interpretation: Sinus rhythm Anterior infarct, old Nonspecific T abnormalities, lateral leads Confirmed by Regan Lemming ([id number]) on 11/04/2022 11:03:07 PM  Radiology DG Chest Port 1 View  Result Date: 11/04/2022 CLINICAL DATA:  Respiratory distress with wheezing EXAM: PORTABLE CHEST 1 VIEW COMPARISON:  None Available. FINDINGS: Hyperinflation. No acute airspace disease or effusion. Normal cardiac size. No pneumothorax IMPRESSION: Hyperinflation without focal airspace disease Electronically Signed   By: Donavan Foil M.D.   On: 11/04/2022 22:43    Procedures .Critical Care  Performed by: Regan Lemming, MD Authorized by: Regan Lemming, MD   Critical care provider statement:    Critical care time (minutes):  30   Critical care was time spent personally by me on the following activities:  Development of treatment plan with patient or surrogate, discussions with consultants, evaluation of patient's response to treatment, examination of patient, ordering and review of laboratory studies, ordering and review of radiographic studies, ordering and performing treatments and interventions, pulse oximetry, re-evaluation of patient's condition and review of old charts     Medications Ordered in ED Medications  methylPREDNISolone sodium succinate (SOLU-MEDROL) 125 mg/2 mL injection (  Not  Given 11/04/22 2223)  albuterol (PROVENTIL) (2.5 MG/3ML) 0.083% nebulizer solution (10 mg/hr Nebulization Given 11/04/22 2226)  magnesium sulfate IVPB 2 g 50 mL (0 g Intravenous Stopped 11/04/22 2317)  ondansetron (ZOFRAN) injection 4 mg (4 mg Intravenous Given 11/04/22 2220)    ED Course/ Medical Decision Making/ A&P Clinical Course as of 11/05/22 0000  Sat Nov 04, 2022  2303 D-Dimer, Quant(!): 1.98 [JL]    Clinical Course User Index [JL] Regan Lemming, MD                           Medical Decision Making Amount and/or Complexity of Data Reviewed Labs: ordered. Decision-making details documented in ED Course. Radiology: ordered.  Risk Prescription drug management.    61 year old female with history significant for GERD who presents to the emergency department in respiratory distress.  The patient states that she has no  known history of asthma or COPD.  She states that over the last several days she has had worsening shortness of breath with chest tightness.  She denies any chest pain.  She endorses a mild nonproductive cough.  Her breathing worsened tonight and she called EMS where she was found to be diffusely wheezing.  She was placed on CPAP, administered 2 g of magnesium, 0.3 mg of IM epinephrine, 2 albuterol nebulizers and 125 mg of IV Solu-Medrol.  She had an episode of nausea and vomiting in route and CPAP was stopped.  On arrival, the patient was in respiratory distress, hypoxic 89% on room air, tachycardic P109, tachypneic in the 30s, afebrile, and normotensive.  Patient was in respiratory distress with tripoding and diffuse expiratory wheezing present.  Concern for undiagnosed obstructive disease such as asthma/COPD.  Patient was restarted on BiPAP and administered additional 2 g of magnesium and started on continuous albuterol nebulizer.  Initially considered COVID-19, influenza, RSV. PCR testing was ordered.  Laboratory evaluation significant COVID-19, influenza, RSV PCR  testing negative, CBC with a nonspecific leukocytosis to 11.6, normal hemoglobin, D-dimer elevated to 1.98, troponins collected and pending, CMP collected and pending.  Chest x-ray performed which revealed hyperinflation but otherwise no acute abnormality.  Given the patient's elevated D-dimer in the setting of respiratory distress and hypoxia, CT angio PE study was ordered which was pending at time of signout.  An EKG was performed revealed sinus tachycardia, no STEMI noted.  At time of signout to admit the patient following CT imaging.  On repeat assessment prior to signout, the patient's respiratory distress had significantly improved on BiPAP, lungs were moving air well, patient was off continuous.  Plan for likely de-escalation for potential floor admission. Signout given to Dr. Leonette Monarch at 0000.  Final Clinical Impression(s) / ED Diagnoses Final diagnoses:  Respiratory distress    Rx / DC Orders ED Discharge Orders     None         Regan Lemming, MD 11/05/22 0000

## 2022-11-05 ENCOUNTER — Emergency Department (HOSPITAL_COMMUNITY): Payer: 59

## 2022-11-05 ENCOUNTER — Other Ambulatory Visit: Payer: Self-pay

## 2022-11-05 ENCOUNTER — Encounter (HOSPITAL_COMMUNITY): Payer: Self-pay

## 2022-11-05 DIAGNOSIS — J4521 Mild intermittent asthma with (acute) exacerbation: Secondary | ICD-10-CM | POA: Diagnosis not present

## 2022-11-05 DIAGNOSIS — K219 Gastro-esophageal reflux disease without esophagitis: Secondary | ICD-10-CM | POA: Diagnosis present

## 2022-11-05 DIAGNOSIS — Z87891 Personal history of nicotine dependence: Secondary | ICD-10-CM | POA: Diagnosis not present

## 2022-11-05 DIAGNOSIS — J9601 Acute respiratory failure with hypoxia: Secondary | ICD-10-CM

## 2022-11-05 DIAGNOSIS — Z79899 Other long term (current) drug therapy: Secondary | ICD-10-CM | POA: Diagnosis not present

## 2022-11-05 DIAGNOSIS — E876 Hypokalemia: Secondary | ICD-10-CM | POA: Diagnosis not present

## 2022-11-05 DIAGNOSIS — J209 Acute bronchitis, unspecified: Secondary | ICD-10-CM | POA: Diagnosis present

## 2022-11-05 DIAGNOSIS — R112 Nausea with vomiting, unspecified: Secondary | ICD-10-CM | POA: Diagnosis present

## 2022-11-05 DIAGNOSIS — R0603 Acute respiratory distress: Secondary | ICD-10-CM | POA: Diagnosis present

## 2022-11-05 DIAGNOSIS — J45901 Unspecified asthma with (acute) exacerbation: Secondary | ICD-10-CM | POA: Diagnosis present

## 2022-11-05 DIAGNOSIS — Z1152 Encounter for screening for COVID-19: Secondary | ICD-10-CM | POA: Diagnosis not present

## 2022-11-05 DIAGNOSIS — J309 Allergic rhinitis, unspecified: Secondary | ICD-10-CM | POA: Diagnosis present

## 2022-11-05 LAB — BASIC METABOLIC PANEL
Anion gap: 7 (ref 5–15)
BUN: 16 mg/dL (ref 8–23)
CO2: 23 mmol/L (ref 22–32)
Calcium: 8.5 mg/dL — ABNORMAL LOW (ref 8.9–10.3)
Chloride: 108 mmol/L (ref 98–111)
Creatinine, Ser: 0.85 mg/dL (ref 0.44–1.00)
GFR, Estimated: >60 mL/min (ref >60)
Glucose, Bld: 244 mg/dL — ABNORMAL HIGH (ref 70–99)
Potassium: 5.1 mmol/L (ref 3.5–5.1)
Sodium: 138 mmol/L (ref 135–145)

## 2022-11-05 LAB — COMPREHENSIVE METABOLIC PANEL
ALT: 27 U/L (ref 0–44)
AST: 30 U/L (ref 15–41)
Albumin: 3.9 g/dL (ref 3.5–5.0)
Alkaline Phosphatase: 62 U/L (ref 38–126)
Anion gap: 11 (ref 5–15)
BUN: 16 mg/dL (ref 8–23)
CO2: 24 mmol/L (ref 22–32)
Calcium: 8.7 mg/dL — ABNORMAL LOW (ref 8.9–10.3)
Chloride: 104 mmol/L (ref 98–111)
Creatinine, Ser: 0.66 mg/dL (ref 0.44–1.00)
GFR, Estimated: >60 mL/min (ref >60)
Glucose, Bld: 167 mg/dL — ABNORMAL HIGH (ref 70–99)
Potassium: 2.9 mmol/L — ABNORMAL LOW (ref 3.5–5.1)
Sodium: 139 mmol/L (ref 135–145)
Total Bilirubin: 0.8 mg/dL (ref 0.3–1.2)
Total Protein: 6.8 g/dL (ref 6.5–8.1)

## 2022-11-05 LAB — CBC
HCT: 37.3 % (ref 36.0–46.0)
Hemoglobin: 12.6 g/dL (ref 12.0–15.0)
MCH: 33.3 pg (ref 26.0–34.0)
MCHC: 33.8 g/dL (ref 30.0–36.0)
MCV: 98.7 fL (ref 80.0–100.0)
Platelets: 232 10*3/uL (ref 150–400)
RBC: 3.78 MIL/uL — ABNORMAL LOW (ref 3.87–5.11)
RDW: 12.9 % (ref 11.5–15.5)
WBC: 7.6 10*3/uL (ref 4.0–10.5)
nRBC: 0 % (ref 0.0–0.2)

## 2022-11-05 LAB — MAGNESIUM: Magnesium: 2.3 mg/dL (ref 1.7–2.4)

## 2022-11-05 LAB — HIV ANTIBODY (ROUTINE TESTING W REFLEX): HIV Screen 4th Generation wRfx: NONREACTIVE

## 2022-11-05 LAB — TROPONIN I (HIGH SENSITIVITY)
Troponin I (High Sensitivity): 11 ng/L (ref <18)
Troponin I (High Sensitivity): 15 ng/L (ref <18)

## 2022-11-05 MED ORDER — ACETAMINOPHEN 325 MG PO TABS: 650.0000 mg | ORAL_TABLET | Freq: Four times a day (QID) | ORAL | Status: DC | PRN

## 2022-11-05 MED ORDER — ONDANSETRON HCL 4 MG PO TABS: 4.0000 mg | ORAL_TABLET | Freq: Four times a day (QID) | ORAL | Status: DC | PRN

## 2022-11-05 MED ORDER — PANTOPRAZOLE SODIUM 40 MG IV SOLR
40.0000 mg | INTRAVENOUS | Status: DC
  Administered 2022-11-06: 40 mg via INTRAVENOUS
  Filled 2022-11-05: qty 10

## 2022-11-05 MED ORDER — METHYLPREDNISOLONE SODIUM SUCC 125 MG IJ SOLR
125.0000 mg | Freq: Two times a day (BID) | INTRAMUSCULAR | Status: DC
  Administered 2022-11-05: 125 mg via INTRAVENOUS
  Filled 2022-11-05: qty 2

## 2022-11-05 MED ORDER — PANTOPRAZOLE SODIUM 40 MG PO TBEC
40.0000 mg | DELAYED_RELEASE_TABLET | Freq: Two times a day (BID) | ORAL | Status: DC
  Administered 2022-11-05: 40 mg via ORAL
  Filled 2022-11-05: qty 1

## 2022-11-05 MED ORDER — ONDANSETRON HCL 4 MG/2ML IJ SOLN: 4.0000 mg | Freq: Four times a day (QID) | INTRAMUSCULAR | Status: DC | PRN

## 2022-11-05 MED ORDER — POTASSIUM CHLORIDE CRYS ER 20 MEQ PO TBCR
40.0000 meq | EXTENDED_RELEASE_TABLET | Freq: Once | ORAL | Status: AC
  Administered 2022-11-05: 40 meq via ORAL
  Filled 2022-11-05: qty 2

## 2022-11-05 MED ORDER — SODIUM CHLORIDE 0.9% FLUSH
3.0000 mL | Freq: Two times a day (BID) | INTRAVENOUS | Status: DC
  Administered 2022-11-05 – 2022-11-06 (×5): 3 mL via INTRAVENOUS

## 2022-11-05 MED ORDER — METHYLPREDNISOLONE SODIUM SUCC 125 MG IJ SOLR
60.0000 mg | Freq: Two times a day (BID) | INTRAMUSCULAR | Status: DC
  Filled 2022-11-05: qty 2

## 2022-11-05 MED ORDER — HYDROCODONE-ACETAMINOPHEN 5-325 MG PO TABS: 1.0000 | ORAL_TABLET | ORAL | Status: DC | PRN

## 2022-11-05 MED ORDER — IPRATROPIUM-ALBUTEROL 0.5-2.5 (3) MG/3ML IN SOLN
3.0000 mL | RESPIRATORY_TRACT | Status: DC
  Administered 2022-11-05 – 2022-11-06 (×5): 3 mL via RESPIRATORY_TRACT
  Filled 2022-11-05 (×5): qty 3

## 2022-11-05 MED ORDER — ALBUTEROL SULFATE (2.5 MG/3ML) 0.083% IN NEBU: 2.5000 mg | INHALATION_SOLUTION | RESPIRATORY_TRACT | Status: DC | PRN

## 2022-11-05 MED ORDER — IOHEXOL 350 MG/ML SOLN
75.0000 mL | Freq: Once | INTRAVENOUS | Status: AC | PRN
  Administered 2022-11-05: 75 mL via INTRAVENOUS

## 2022-11-05 MED ORDER — GUAIFENESIN 100 MG/5ML PO LIQD
10.0000 mL | ORAL | Status: DC | PRN
  Administered 2022-11-05 – 2022-11-07 (×4): 10 mL via ORAL
  Filled 2022-11-05 (×4): qty 10

## 2022-11-05 MED ORDER — ACETAMINOPHEN 650 MG RE SUPP: 650.0000 mg | Freq: Four times a day (QID) | RECTAL | Status: DC | PRN

## 2022-11-05 MED ORDER — POTASSIUM CHLORIDE 10 MEQ/100ML IV SOLN
10.0000 meq | Freq: Once | INTRAVENOUS | Status: AC
  Administered 2022-11-05: 10 meq via INTRAVENOUS
  Filled 2022-11-05: qty 100

## 2022-11-05 MED ORDER — HYDROXYZINE HCL 25 MG PO TABS
25.0000 mg | ORAL_TABLET | Freq: Three times a day (TID) | ORAL | Status: DC | PRN
  Administered 2022-11-05: 25 mg via ORAL
  Filled 2022-11-05 (×3): qty 1

## 2022-11-05 MED ORDER — ENOXAPARIN SODIUM 40 MG/0.4ML IJ SOSY: 40.0000 mg | PREFILLED_SYRINGE | INTRAMUSCULAR | Status: DC

## 2022-11-05 MED ORDER — GUAIFENESIN 100 MG/5ML PO LIQD: 5.0000 mL | ORAL | Status: DC | PRN

## 2022-11-05 MED ORDER — SENNOSIDES-DOCUSATE SODIUM 8.6-50 MG PO TABS: 1.0000 | ORAL_TABLET | Freq: Every evening | ORAL | Status: DC | PRN

## 2022-11-05 NOTE — Plan of Care (Signed)

## 2022-11-05 NOTE — Progress Notes (Signed)
Pt off BIPAP at this time. No distress noted. 

## 2022-11-05 NOTE — Progress Notes (Signed)
PT refuse BiPAP. Not in any respiratory distress.

## 2022-11-05 NOTE — H&P (Signed)
History and Physical    Kathryn Myers:[id number] DOB: 1961-10-10 DOA: 11/04/2022  PCP: Denita Lung, MD   Patient coming from: Home   Chief Complaint: SOB, cough, and wheezing  HPI: Kathryn Myers is a 61 y.o. female with medical history significant for asthma and GERD who presents emergency department with shortness of breath, cough, and wheezing.  Patient reports that she has been dealing with severe acid reflux symptoms for the past 3 or 4 months, and has been experiencing more frequent asthma flares over the same interval.  She has had some improvement with twice daily PPI, but continues to require albuterol frequently.  The albuterol inhaler typically relieves her symptoms but yesterday, she was experiencing severe dyspnea, chest tightness, and wheezing that did not respond to her inhaler.  She denies leg swelling, fevers, chills, or angina. She is scheduled to see ENT on January 10th.   ED Course: Upon arrival to the ED, patient is found to be afebrile and saturating upper 80s with tachypnea, mild tachycardia, and stable blood pressure.  Blood work notable for potassium 2.9, WBC 11,600, BNP 43, and D-dimer 1.98.  CTA chest is negative for PE or other acute intrathoracic abnormality.  She was started on BiPAP in the emergency department initially though now stable on nasal cannula.  She was treated with 10 mg nebulized albuterol and 2 g of IV magnesium in the ED.  Review of Systems:  All other systems reviewed and apart from HPI, are negative.  Past Medical History:  Diagnosis Date   Allergic rhinitis    Femoral hernia of right side    Hemorrhoids    Hx of abnormal cervical Pap smear    Osteopenia 11/2012   repeat in 3-5 years   Venereal disease contact    uses condoms, husband is HIV+   Wears glasses     Past Surgical History:  Procedure Laterality Date   COLONOSCOPY  12/2009   Dr. Benson Norway; sessile polyp, medium hemorrhoids, diverticulum   FEMORAL HERNIA REPAIR Right  10/22/2018   Procedure: RIGHT FEMORAL HERNIA REPAIR WITH MESH;  Surgeon: Donnie Mesa, MD;  Location: [place];  Service: General;  Laterality: Right;   INGUINAL HERNIA REPAIR  2008   left   INSERTION OF MESH Right 10/22/2018   Procedure: INSERTION OF MESH;  Surgeon: Donnie Mesa, MD;  Location: [place];  Service: General;  Laterality: Right;   SKIN BIOPSY Left 10/20/2021   compond nevus with moderate atypia    Social History:   reports that she quit smoking about 31 years ago. Her smoking use included cigarettes. She has a 2.50 pack-year smoking history. She has never used smokeless tobacco. She reports current alcohol use of about 7.0 standard drinks of alcohol per week. She reports that she does not use drugs.  No Known Allergies  Family History  Problem Relation Age of Onset   Hyperlipidemia Mother    Osteoporosis Mother    Parkinsonism Father    Hyperlipidemia Father    Hypertension Father    Heart disease Father    Cancer Brother        bone cancer as a child   Cancer Paternal Uncle        pancreatic   Heart disease Maternal Grandfather    Osteoporosis Maternal Grandmother    Breast cancer Maternal Aunt 80   Stroke Neg Hx    Diabetes Neg Hx      Prior to Admission medications  Medication Sig Start Date End Date Taking? Authorizing Provider  albuterol (VENTOLIN HFA) 108 (90 Base) MCG/ACT inhaler Inhale 2 puffs into the lungs every 6 (six) hours as needed for wheezing or shortness of breath. 09/06/22  Yes Denita Lung, MD  B Complex-C (B-COMPLEX WITH VITAMIN C) tablet Take 1 tablet by mouth daily.   Yes [provider]  Calcium Carbonate-Vitamin D 600-200 MG-UNIT TABS Take 2 tablets by mouth daily.   Yes [provider]  docusate sodium (COLACE) 100 MG capsule Take 100 mg by mouth daily as needed for mild constipation.   Yes [provider]  Multiple Vitamin (MULTIVITAMIN ADULT PO) Take 1 tablet by mouth  daily.   Yes [provider]  Omega-3 Fatty Acids (SALMON OIL PO) Take 1 capsule by mouth daily.   Yes [provider]  pantoprazole (PROTONIX) 40 MG tablet Take 1 tablet (40 mg total) by mouth 2 (two) times daily. 09/06/22  Yes Denita Lung, MD    Physical Exam: Vitals:   11/05/22 0230 11/05/22 0300 11/05/22 0330 11/05/22 0400  BP: (!) 94/56 (!) 101/58 (!) 97/57 109/66  Pulse: 96 95 94 90  Resp: '16 18 19 '$ (!) 22  Temp:      TempSrc:      SpO2: 96% 97% 98% 98%    Constitutional: NAD, no pallor or diaphoresis   Eyes: PERTLA, lids and conjunctivae normal ENMT: Mucous membranes are moist. Posterior pharynx clear of any exudate or lesions.   Neck: supple, no masses  Respiratory: Diminished bilaterally with prolonged expiratory phase. No accessory muscle use.  Cardiovascular: S1 & S2 heard, regular rate and rhythm. No extremity edema.   Abdomen: No distension, no tenderness, soft. Bowel sounds active.  Musculoskeletal: no clubbing / cyanosis. No joint deformity upper and lower extremities.   Skin: no significant rashes, lesions, ulcers. Warm, dry, well-perfused. Neurologic: CN 2-12 grossly intact. Moving all extremities. Alert and oriented.  Psychiatric: Calm. Cooperative.    Labs and Imaging on Admission: I have personally reviewed following labs and imaging studies  CBC: Recent Labs  Lab 11/04/22 2216  WBC 11.6*  NEUTROABS 5.6  HGB 14.8  HCT 43.7  MCV 99.1  PLT 073   Basic Metabolic Panel: Recent Labs  Lab 11/04/22 2356  NA 139  K 2.9*  CL 104  CO2 24  GLUCOSE 167*  BUN 16  CREATININE 0.66  CALCIUM 8.7*   GFR: CrCl cannot be calculated (Unknown ideal weight.). Liver Function Tests: Recent Labs  Lab 11/04/22 2356  AST 30  ALT 27  ALKPHOS 62  BILITOT 0.8  PROT 6.8  ALBUMIN 3.9   No results for input(s): "LIPASE", "AMYLASE" in the last 168 hours. No results for input(s): "AMMONIA" in the last 168 hours. Coagulation Profile: No  results for input(s): "INR", "PROTIME" in the last 168 hours. Cardiac Enzymes: No results for input(s): "CKTOTAL", "CKMB", "CKMBINDEX", "TROPONINI" in the last 168 hours. BNP (last 3 results) No results for input(s): "PROBNP" in the last 8760 hours. HbA1C: No results for input(s): "HGBA1C" in the last 72 hours. CBG: No results for input(s): "GLUCAP" in the last 168 hours. Lipid Profile: No results for input(s): "CHOL", "HDL", "LDLCALC", "TRIG", "CHOLHDL", "LDLDIRECT" in the last 72 hours. Thyroid Function Tests: No results for input(s): "TSH", "T4TOTAL", "FREET4", "T3FREE", "THYROIDAB" in the last 72 hours. Anemia Panel: No results for input(s): "VITAMINB12", "FOLATE", "FERRITIN", "TIBC", "IRON", "RETICCTPCT" in the last 72 hours. Urine analysis:    Component Value Date/Time  LABSPEC 1.025 01/02/2020 1622   BILIRUBINUR small (A) 01/02/2020 1622   BILIRUBINUR n 10/25/2015 1429   KETONESUR trace (5) (A) 01/02/2020 1622   PROTEINUR negative 01/02/2020 1622   PROTEINUR n 10/25/2015 1429   UROBILINOGEN 0.2 10/25/2015 1429   NITRITE Negative 01/02/2020 1622   NITRITE n 10/25/2015 1429   LEUKOCYTESUR Negative 01/02/2020 1622   Sepsis Labs: '@LABRCNTIP'$ (procalcitonin:4,lacticidven:4) ) Recent Results (from the past 240 hour(s))  Resp panel by RT-PCR (RSV, Flu A&B, Covid) Anterior Nasal Swab     Status: None   Collection Time: 11/04/22 10:36 PM   Specimen: Anterior Nasal Swab  Result Value Ref Range Status   SARS Coronavirus 2 by RT PCR NEGATIVE NEGATIVE Final    Comment: (NOTE) SARS-CoV-2 target nucleic acids are NOT DETECTED.  The SARS-CoV-2 RNA is generally detectable in upper respiratory specimens during the acute phase of infection. The lowest concentration of SARS-CoV-2 viral copies this assay can detect is 138 copies/mL. A negative result does not preclude SARS-Cov-2 infection and should not be used as the sole basis for treatment or other patient management decisions. A  negative result may occur with  improper specimen collection/handling, submission of specimen other than nasopharyngeal swab, presence of viral mutation(s) within the areas targeted by this assay, and inadequate number of viral copies(<138 copies/mL). A negative result must be combined with clinical observations, patient history, and epidemiological information. The expected result is Negative.  Fact Sheet for Patients:  EntrepreneurPulse.com.au  Fact Sheet for Healthcare Providers:  IncredibleEmployment.be  This test is no t yet approved or cleared by the [place] FDA and  has been authorized for detection and/or diagnosis of SARS-CoV-2 by FDA under an Emergency Use Authorization (EUA). This EUA will remain  in effect (meaning this test can be used) for the duration of the COVID-19 declaration under Section 564(b)(1) of the Act, 21 U.S.C.section 360bbb-3(b)(1), unless the authorization is terminated  or revoked sooner.       Influenza A by PCR NEGATIVE NEGATIVE Final   Influenza B by PCR NEGATIVE NEGATIVE Final    Comment: (NOTE) The Xpert Xpress SARS-CoV-2/FLU/RSV plus assay is intended as an aid in the diagnosis of influenza from Nasopharyngeal swab specimens and should not be used as a sole basis for treatment. Nasal washings and aspirates are unacceptable for Xpert Xpress SARS-CoV-2/FLU/RSV testing.  Fact Sheet for Patients: EntrepreneurPulse.com.au  Fact Sheet for Healthcare Providers: IncredibleEmployment.be  This test is not yet approved or cleared by the [place] FDA and has been authorized for detection and/or diagnosis of SARS-CoV-2 by FDA under an Emergency Use Authorization (EUA). This EUA will remain in effect (meaning this test can be used) for the duration of the COVID-19 declaration under Section 564(b)(1) of the Act, 21 U.S.C. section 360bbb-3(b)(1), unless the authorization  is terminated or revoked.     Resp Syncytial Virus by PCR NEGATIVE NEGATIVE Final    Comment: (NOTE) Fact Sheet for Patients: EntrepreneurPulse.com.au  Fact Sheet for Healthcare Providers: IncredibleEmployment.be  This test is not yet approved or cleared by the [place] FDA and has been authorized for detection and/or diagnosis of SARS-CoV-2 by FDA under an Emergency Use Authorization (EUA). This EUA will remain in effect (meaning this test can be used) for the duration of the COVID-19 declaration under Section 564(b)(1) of the Act, 21 U.S.C. section 360bbb-3(b)(1), unless the authorization is terminated or revoked.  Performed at [facility], [place] [address], [place], [place] [address]      Radiological Exams  on Admission: CT Angio Chest PE W and/or Wo Contrast  Result Date: 11/05/2022 CLINICAL DATA:  Chest tightness and worsening shortness of breath. EXAM: CT ANGIOGRAPHY CHEST WITH CONTRAST TECHNIQUE: Multidetector CT imaging of the chest was performed using the standard protocol during bolus administration of intravenous contrast. Multiplanar CT image reconstructions and MIPs were obtained to evaluate the vascular anatomy. RADIATION DOSE REDUCTION: This exam was performed according to the departmental dose-optimization program which includes automated exposure control, adjustment of the mA and/or kV according to patient size and/or use of iterative reconstruction technique. CONTRAST:  49m OMNIPAQUE IOHEXOL 350 MG/ML SOLN COMPARISON:  December 07, 2020 FINDINGS: Cardiovascular: Satisfactory opacification of the pulmonary arteries to the segmental level. No evidence of pulmonary embolism. Normal heart size. No pericardial effusion. Mediastinum/Nodes: No enlarged mediastinal, hilar, or axillary lymph nodes. Thyroid gland, trachea, and esophagus demonstrate no significant findings. Lungs/Pleura: Very mild biapical scarring and/or  atelectasis is seen. There is no evidence of acute infiltrate, pleural effusion or pneumothorax. Upper Abdomen: No acute abnormality. Musculoskeletal: No chest wall abnormality. No acute or significant osseous findings. Review of the MIP images confirms the above findings. IMPRESSION: 1. No evidence of pulmonary embolism or other acute intrathoracic process. 2. Very mild biapical scarring and/or atelectasis. Electronically Signed   By: TVirgina NorfolkM.D.   On: 11/05/2022 02:29   DG Chest Port 1 View  Result Date: 11/04/2022 CLINICAL DATA:  Respiratory distress with wheezing EXAM: PORTABLE CHEST 1 VIEW COMPARISON:  None Available. FINDINGS: Hyperinflation. No acute airspace disease or effusion. Normal cardiac size. No pneumothorax IMPRESSION: Hyperinflation without focal airspace disease Electronically Signed   By: KDonavan FoilM.D.   On: 11/04/2022 22:43    EKG: Independently reviewed. Sinus rhythm.   Assessment/Plan  1. Asthma with acute exacerbation; acute hypoxic respiratory failure  - Treated with 2 g IV mag, 125 mg IV Solu-Medrol, albuterol, and epi by EMS pta; required BiPAP in ED initially, now on 3 Lpm supplemental O2 via [place]  - Continue systemic steroids, continue albuterol as needed, continue supplemental O2 as needed    2. Hypokalemia  - Replacing    3. GERD - Continue PPI    DVT prophylaxis: SCDs  Code Status: Full  Level of Care: Level of care: Progressive Family Communication: none present  Disposition Plan:  Patient is from: home  Anticipated d/c is to: Home  Anticipated d/c date is: 1/1 or 1/2//24 Patient currently: pending improved respiratory status  Consults called: None  Admission status: Observation     TVianne Bulls MD [facility]  11/05/2022, 4:13 AM

## 2022-11-05 NOTE — ED Notes (Signed)
Pt transferred to bedside recliner per pt request for comfort.

## 2022-11-05 NOTE — ED Provider Notes (Signed)
I assumed care of this patient.  Please see previous provider note for further details of Hx, PE.  Briefly patient is a 61 y.o. female who presented respiratory distress concerning for undiagnosed COPD exacerbation.  Pending CT PE  CT negative. Wean off of BIPAP to [facility] after treatments.  Plan to admit        Arlone Lenhardt, Grayce Sessions, MD 11/05/22 [phone]

## 2022-11-05 NOTE — Progress Notes (Signed)
Pt removed from bipap and doing well at this time. Bipap remained bedside. No issues noted at this time.

## 2022-11-05 NOTE — ED Notes (Signed)
Pt removed off of Bi-Pap by RT, pt tolerating NRB mask without issue.

## 2022-11-05 NOTE — ED Notes (Signed)
Pt has been tolerating nasal cannula without issue, pt continues to be able to speak in full sentences, NAD noted.

## 2022-11-05 NOTE — Progress Notes (Signed)
Pt seen and examined at bedside.   Kathryn Myers is a 61 y.o. female with medical history significant for asthma and GERD who presents emergency department with shortness of breath, cough, and wheezing.   She is admitted for acute asthma exacerbation. She initially required BIPAP.  General exam: Appears calm and comfortable  Respiratory system: diminished air entry at bases. On [place] oxygen.  Cardiovascular system: S1 & S2 heard, RRR. No JVD,  No pedal edema. Gastrointestinal system: Abdomen is nondistended, soft and nontender. Normal bowel sounds heard. Central nervous system: Alert and oriented. No focal neurological deficits. Extremities: Symmetric 5 x 5 power. Skin: No rashes, lesions or ulcers Psychiatry: anxious.    PLAN: IV steroids. , BIPAP as needed. Duonebs prn.  2. Replaced K.   Hosie Poisson, MD

## 2022-11-05 NOTE — ED Notes (Signed)
Pt expresses respiratory effort improved while being on Bi-Pap, pt able to speak full sentences without issue.

## 2022-11-05 NOTE — Progress Notes (Signed)
RT called to do breathing treatment and pt was found on BIPAP for WOB and SOB.

## 2022-11-05 NOTE — ED Notes (Signed)
ED TO INPATIENT HANDOFF REPORT  Name/Age/Gender Kathryn Myers 61 y.o. female  Code Status    Code Status Orders  (From admission, onward)           Start     Ordered   11/05/22 0334  Full code  Continuous       Question:  By:  Answer:  Consent: discussion documented in EHR   11/05/22 0335           Code Status History     This patient has a current code status but no historical code status.       Home/SNF/Other Home  Chief Complaint Acute asthma exacerbation [J45.901]  Level of Care/Admitting Diagnosis ED Disposition     ED Disposition  Admit   Condition  --   [place]: [place] [100102]  Level of Care: Progressive [102]  Admit to Progressive based on following criteria: RESPIRATORY PROBLEMS hypoxemic/hypercapnic respiratory failure that is responsive to NIPPV (BiPAP) or High Flow Nasal Cannula (6-80 lpm). Frequent assessment/intervention, no > Q2 hrs < Q4 hrs, to maintain oxygenation and pulmonary hygiene.  May place patient in observation at [facility] or [place] if equivalent level of care is available:: Yes  Covid Evaluation: Confirmed COVID Negative  Diagnosis: Acute asthma exacerbation [226333]  Admitting Physician: Vianne Bulls [[id number]]  Attending Physician: Vianne Bulls [[id number]]          Medical History Past Medical History:  Diagnosis Date   Allergic rhinitis    Femoral hernia of right side    Hemorrhoids    Hx of abnormal cervical Pap smear    Osteopenia 11/2012   repeat in 3-5 years   Venereal disease contact    uses condoms, husband is HIV+   Wears glasses     Allergies No Known Allergies  IV Location/Drains/Wounds Patient Lines/Drains/Airways Status     Active Line/Drains/Airways     Name Placement date Placement time Site Days   Peripheral IV 11/04/22 18 G Left Antecubital 11/04/22  2210  Antecubital  1   Peripheral IV 11/05/22 22 G 1" Anterior;Left Forearm 11/05/22   0000  Forearm  less than 1   Incision (Closed) 10/22/18 Groin Right 10/22/18  2876  -- 1475            Labs/Imaging Results for orders placed or performed during the hospital encounter of 11/04/22 (from the past 48 hour(s))  Brain natriuretic peptide     Status: None   Collection Time: 11/04/22 10:16 PM  Result Value Ref Range   B Natriuretic Peptide 43.1 0.0 - 100.0 pg/mL    Comment: Performed at [facility], [place] [address] [place] [address] [place], [place] [address]  Blood gas, venous     Status: Abnormal   Collection Time: 11/04/22 10:16 PM  Result Value Ref Range   pH, Ven 7.27 7.25 - 7.43   pCO2, Ven 55 44 - 60 mmHg   pO2, Ven 35 32 - 45 mmHg   Bicarbonate 25.3 20.0 - 28.0 mmol/L   Acid-base deficit 2.5 (H) 0.0 - 2.0 mmol/L   O2 Saturation 47.4 %   Patient temperature 37.1     Comment: Performed at [facility], [place] [address] [place], [place] [address]  D-dimer, quantitative     Status: Abnormal   Collection Time: 11/04/22 10:16 PM  Result Value Ref Range   D-Dimer, Quant 1.98 (H) 0.00 - 0.50 ug/mL-FEU    Comment: (NOTE) At  the manufacturer cut-off value of 0.5 g/mL FEU, this assay has a negative predictive value of 95-100%.This assay is intended for use in conjunction with a clinical pretest probability (PTP) assessment model to exclude pulmonary embolism (PE) and deep venous thrombosis (DVT) in outpatients suspected of PE or DVT. Results should be correlated with clinical presentation. Performed at [facility], [place] [address] [place], [place] [address]   CBC with Differential/Platelet     Status: Abnormal   Collection Time: 11/04/22 10:16 PM  Result Value Ref Range   WBC 11.6 (H) 4.0 - 10.5 K/uL   RBC 4.41 3.87 - 5.11 MIL/uL   Hemoglobin 14.8 12.0 - 15.0 g/dL   HCT 43.7 36.0 - 46.0 %   MCV 99.1 80.0 - 100.0 fL   MCH 33.6 26.0 - 34.0 pg   MCHC 33.9 30.0 - 36.0 g/dL   RDW 12.6 11.5 - 15.5 %   Platelets 285 150 -  400 K/uL   nRBC 0.0 0.0 - 0.2 %   Neutrophils Relative % 49 %   Neutro Abs 5.6 1.7 - 7.7 K/uL   Lymphocytes Relative 37 %   Lymphs Abs 4.3 (H) 0.7 - 4.0 K/uL   Monocytes Relative 4 %   Monocytes Absolute 0.5 0.1 - 1.0 K/uL   Eosinophils Relative 9 %   Eosinophils Absolute 1.1 (H) 0.0 - 0.5 K/uL   Basophils Relative 1 %   Basophils Absolute 0.1 0.0 - 0.1 K/uL   Immature Granulocytes 0 %   Abs Immature Granulocytes 0.03 0.00 - 0.07 K/uL    Comment: Performed at [facility], [place] [address] [place],  [address]  Resp panel by RT-PCR (RSV, Flu A&B, Covid) Anterior Nasal Swab     Status: None   Collection Time: 11/04/22 10:36 PM   Specimen: Anterior Nasal Swab  Result Value Ref Range   SARS Coronavirus 2 by RT PCR NEGATIVE NEGATIVE    Comment: (NOTE) SARS-CoV-2 target nucleic acids are NOT DETECTED.  The SARS-CoV-2 RNA is generally detectable in upper respiratory specimens during the acute phase of infection. The lowest concentration of SARS-CoV-2 viral copies this assay can detect is 138 copies/mL. A negative result does not preclude SARS-Cov-2 infection and should not be used as the sole basis for treatment or other patient management decisions. A negative result may occur with  improper specimen collection/handling, submission of specimen other than nasopharyngeal swab, presence of viral mutation(s) within the areas targeted by this assay, and inadequate number of viral copies(<138 copies/mL). A negative result must be combined with clinical observations, patient history, and epidemiological information. The expected result is Negative.  Fact Sheet for Patients:  EntrepreneurPulse.com.au  Fact Sheet for Healthcare Providers:  IncredibleEmployment.be  This test is no t yet approved or cleared by the [place] FDA and  has been authorized for detection and/or diagnosis of SARS-CoV-2 by FDA under an Emergency Use  Authorization (EUA). This EUA will remain  in effect (meaning this test can be used) for the duration of the COVID-19 declaration under Section 564(b)(1) of the Act, 21 U.S.C.section 360bbb-3(b)(1), unless the authorization is terminated  or revoked sooner.       Influenza A by PCR NEGATIVE NEGATIVE   Influenza B by PCR NEGATIVE NEGATIVE    Comment: (NOTE) The Xpert Xpress SARS-CoV-2/FLU/RSV plus assay is intended as an aid in the diagnosis of influenza from Nasopharyngeal swab specimens and should not be used as a sole basis for treatment. Nasal washings and aspirates  are unacceptable for Xpert Xpress SARS-CoV-2/FLU/RSV testing.  Fact Sheet for Patients: EntrepreneurPulse.com.au  Fact Sheet for Healthcare Providers: IncredibleEmployment.be  This test is not yet approved or cleared by the [place] FDA and has been authorized for detection and/or diagnosis of SARS-CoV-2 by FDA under an Emergency Use Authorization (EUA). This EUA will remain in effect (meaning this test can be used) for the duration of the COVID-19 declaration under Section 564(b)(1) of the Act, 21 U.S.C. section 360bbb-3(b)(1), unless the authorization is terminated or revoked.     Resp Syncytial Virus by PCR NEGATIVE NEGATIVE    Comment: (NOTE) Fact Sheet for Patients: EntrepreneurPulse.com.au  Fact Sheet for Healthcare Providers: IncredibleEmployment.be  This test is not yet approved or cleared by the [place] FDA and has been authorized for detection and/or diagnosis of SARS-CoV-2 by FDA under an Emergency Use Authorization (EUA). This EUA will remain in effect (meaning this test can be used) for the duration of the COVID-19 declaration under Section 564(b)(1) of the Act, 21 U.S.C. section 360bbb-3(b)(1), unless the authorization is terminated or revoked.  Performed at [facility] [place] [facility], [place] [address] [place], [place] [address]   Comprehensive metabolic panel     Status: Abnormal   Collection Time: 11/04/22 11:56 PM  Result Value Ref Range   Sodium 139 135 - 145 mmol/L   Potassium 2.9 (L) 3.5 - 5.1 mmol/L   Chloride 104 98 - 111 mmol/L   CO2 24 22 - 32 mmol/L   Glucose, Bld 167 (H) 70 - 99 mg/dL    Comment: Glucose reference range applies only to samples taken after fasting for at least 8 hours.   BUN 16 8 - 23 mg/dL   Creatinine, Ser 0.66 0.44 - 1.00 mg/dL   Calcium 8.7 (L) 8.9 - 10.3 mg/dL   Total Protein 6.8 6.5 - 8.1 g/dL   Albumin 3.9 3.5 - 5.0 g/dL   AST 30 15 - 41 U/L   ALT 27 0 - 44 U/L   Alkaline Phosphatase 62 38 - 126 U/L   Total Bilirubin 0.8 0.3 - 1.2 mg/dL   GFR, Estimated >60 >60 mL/min    Comment: (NOTE) Calculated using the CKD-EPI Creatinine Equation (2021)    Anion gap 11 5 - 15    Comment: Performed at [facility], [place] [address], [place], [place] [address]  Troponin I (High Sensitivity)     Status: None   Collection Time: 11/04/22 11:56 PM  Result Value Ref Range   Troponin I (High Sensitivity) 11 <18 ng/L    Comment: (NOTE) Elevated high sensitivity troponin I (hsTnI) values and significant  changes across serial measurements may suggest ACS but many other  chronic and acute conditions are known to elevate hsTnI results.  Refer to the "Links" section for chest pain algorithms and additional  guidance. Performed at [facility], [place] [address], [place], [place] [address]   Troponin I (High Sensitivity)     Status: None   Collection Time: 11/05/22  2:48 AM  Result Value Ref Range   Troponin I (High Sensitivity) 15 <18 ng/L    Comment: (NOTE) Elevated high sensitivity troponin I (hsTnI) values and significant  changes across serial measurements may suggest ACS but many other  chronic and acute conditions are known to elevate hsTnI results.  Refer to the "Links" section for chest pain algorithms and additional   guidance. Performed at [facility], [place] [address], [place], [place] [address]  CT Angio Chest PE W and/or Wo Contrast  Result Date: 11/05/2022 CLINICAL DATA:  Chest tightness and worsening shortness of breath. EXAM: CT ANGIOGRAPHY CHEST WITH CONTRAST TECHNIQUE: Multidetector CT imaging of the chest was performed using the standard protocol during bolus administration of intravenous contrast. Multiplanar CT image reconstructions and MIPs were obtained to evaluate the vascular anatomy. RADIATION DOSE REDUCTION: This exam was performed according to the departmental dose-optimization program which includes automated exposure control, adjustment of the mA and/or kV according to patient size and/or use of iterative reconstruction technique. CONTRAST:  12m OMNIPAQUE IOHEXOL 350 MG/ML SOLN COMPARISON:  December 07, 2020 FINDINGS: Cardiovascular: Satisfactory opacification of the pulmonary arteries to the segmental level. No evidence of pulmonary embolism. Normal heart size. No pericardial effusion. Mediastinum/Nodes: No enlarged mediastinal, hilar, or axillary lymph nodes. Thyroid gland, trachea, and esophagus demonstrate no significant findings. Lungs/Pleura: Very mild biapical scarring and/or atelectasis is seen. There is no evidence of acute infiltrate, pleural effusion or pneumothorax. Upper Abdomen: No acute abnormality. Musculoskeletal: No chest wall abnormality. No acute or significant osseous findings. Review of the MIP images confirms the above findings. IMPRESSION: 1. No evidence of pulmonary embolism or other acute intrathoracic process. 2. Very mild biapical scarring and/or atelectasis. Electronically Signed   By: TVirgina NorfolkM.D.   On: 11/05/2022 02:29   DG Chest Port 1 View  Result Date: 11/04/2022 CLINICAL DATA:  Respiratory distress with wheezing EXAM: PORTABLE CHEST 1 VIEW COMPARISON:  None Available. FINDINGS: Hyperinflation. No acute airspace disease or  effusion. Normal cardiac size. No pneumothorax IMPRESSION: Hyperinflation without focal airspace disease Electronically Signed   By: KDonavan FoilM.D.   On: 11/04/2022 22:43    Pending Labs Unresulted Labs (From admission, onward)     Start     Ordered   11/12/22 0500  Creatinine, serum  (enoxaparin (LOVENOX)    CrCl >/= 30 ml/min)  Weekly,   R     Comments: while on enoxaparin therapy    11/05/22 0335   11/05/22 0500  HIV Antibody (routine testing w rflx)  (HIV Antibody (Routine testing w reflex) panel)  Tomorrow morning,   R        11/05/22 0335   11/05/22 05366 Basic metabolic panel  Daily,   R      11/05/22 0335   11/05/22 0500  Magnesium  Tomorrow morning,   R        11/05/22 0335   11/05/22 0500  CBC  Tomorrow morning,   R        11/05/22 0335            Vitals/Pain Today's Vitals   11/05/22 0230 11/05/22 0300 11/05/22 0330 11/05/22 0400  BP: (!) 94/56 (!) 101/58 (!) 97/57 109/66  Pulse: 96 95 94 90  Resp: '16 18 19 '$ (!) 22  Temp:      TempSrc:      SpO2: 96% 97% 98% 98%  PainSc:        Isolation Precautions No active isolations  Medications Medications  methylPREDNISolone sodium succinate (SOLU-MEDROL) 125 mg/2 mL injection (  Canceled Entry 11/05/22 0056)  pantoprazole (PROTONIX) EC tablet 40 mg (has no administration in time range)  enoxaparin (LOVENOX) injection 40 mg (has no administration in time range)  sodium chloride flush (NS) 0.9 % injection 3 mL (3 mLs Intravenous Given 11/05/22 0351)  acetaminophen (TYLENOL) tablet 650 mg (has no administration in time range)    Or  acetaminophen (TYLENOL) suppository 650 mg (has no  administration in time range)  HYDROcodone-acetaminophen (NORCO/VICODIN) 5-325 MG per tablet 1 tablet (has no administration in time range)  senna-docusate (Senokot-S) tablet 1 tablet (has no administration in time range)  ondansetron (ZOFRAN) tablet 4 mg (has no administration in time range)    Or  ondansetron (ZOFRAN) injection 4 mg  (has no administration in time range)  potassium chloride 10 mEq in 100 mL IVPB (10 mEq Intravenous New Bag/Given 11/05/22 0351)  albuterol (PROVENTIL) (2.5 MG/3ML) 0.083% nebulizer solution 2.5 mg (has no administration in time range)  methylPREDNISolone sodium succinate (SOLU-MEDROL) 125 mg/2 mL injection 125 mg (has no administration in time range)  albuterol (PROVENTIL) (2.5 MG/3ML) 0.083% nebulizer solution (10 mg/hr Nebulization Given 11/04/22 2226)  magnesium sulfate IVPB 2 g 50 mL (0 g Intravenous Stopped 11/04/22 2317)  ondansetron (ZOFRAN) injection 4 mg (4 mg Intravenous Given 11/04/22 2220)  iohexol (OMNIPAQUE) 350 MG/ML injection 75 mL (75 mLs Intravenous Contrast Given 11/05/22 0141)  potassium chloride SA (KLOR-CON M) CR tablet 40 mEq (40 mEq Oral Given 11/05/22 0352)    Mobility walks with person assist

## 2022-11-05 NOTE — ED Notes (Signed)
Pt transport to CT  

## 2022-11-06 DIAGNOSIS — E876 Hypokalemia: Secondary | ICD-10-CM | POA: Diagnosis not present

## 2022-11-06 DIAGNOSIS — J4521 Mild intermittent asthma with (acute) exacerbation: Secondary | ICD-10-CM | POA: Diagnosis not present

## 2022-11-06 DIAGNOSIS — J9601 Acute respiratory failure with hypoxia: Secondary | ICD-10-CM | POA: Diagnosis not present

## 2022-11-06 LAB — BASIC METABOLIC PANEL
Anion gap: 8 (ref 5–15)
BUN: 17 mg/dL (ref 8–23)
CO2: 22 mmol/L (ref 22–32)
Calcium: 8.7 mg/dL — ABNORMAL LOW (ref 8.9–10.3)
Chloride: 108 mmol/L (ref 98–111)
Creatinine, Ser: 0.63 mg/dL (ref 0.44–1.00)
GFR, Estimated: >60 mL/min (ref >60)
Glucose, Bld: 163 mg/dL — ABNORMAL HIGH (ref 70–99)
Potassium: 4.4 mmol/L (ref 3.5–5.1)
Sodium: 138 mmol/L (ref 135–145)

## 2022-11-06 MED ORDER — PANTOPRAZOLE SODIUM 40 MG PO TBEC
40.0000 mg | DELAYED_RELEASE_TABLET | Freq: Every day | ORAL | Status: DC
  Administered 2022-11-07: 40 mg via ORAL
  Filled 2022-11-06: qty 1

## 2022-11-06 MED ORDER — IPRATROPIUM-ALBUTEROL 0.5-2.5 (3) MG/3ML IN SOLN
3.0000 mL | Freq: Three times a day (TID) | RESPIRATORY_TRACT | Status: DC
  Administered 2022-11-06 – 2022-11-07 (×3): 3 mL via RESPIRATORY_TRACT
  Filled 2022-11-06 (×3): qty 3

## 2022-11-06 MED ORDER — FAMOTIDINE 20 MG PO TABS
20.0000 mg | ORAL_TABLET | Freq: Every day | ORAL | Status: DC
  Administered 2022-11-06 – 2022-11-07 (×2): 20 mg via ORAL
  Filled 2022-11-06 (×2): qty 1

## 2022-11-06 MED ORDER — METHYLPREDNISOLONE SODIUM SUCC 125 MG IJ SOLR
60.0000 mg | Freq: Every day | INTRAMUSCULAR | Status: DC
  Administered 2022-11-07: 60 mg via INTRAVENOUS
  Filled 2022-11-06: qty 2

## 2022-11-06 NOTE — Plan of Care (Signed)
Patient reporting doing much better and having limited shortness of breath.  Discuss steroid and effects.  Patient concern over high blood glucose allayed with understanding of side effect of solumedrol.  Able to ambulate with no side effects.

## 2022-11-06 NOTE — Progress Notes (Signed)
Mobility Specialist - Progress Note   11/06/22 1424  Oxygen Therapy  O2 Device Room Air  Mobility  Activity Ambulated independently in hallway  Level of Assistance Independent  Assistive Device None  Distance Ambulated (ft) 1750 ft  Range of Motion/Exercises Active  Activity Response Tolerated well  Mobility Referral Yes  $Mobility charge 1 Mobility   Pt was found on recliner chair and agreeable to ambulate. Had no complaint during session and at EOS returned to recliner chair with all necessities.   Ferd Hibbs Mobility Specialist

## 2022-11-06 NOTE — Progress Notes (Signed)
[facility]                                                                               Kathryn Myers, is a 62 y.o. female, DOB - [birth year], JOA:[id number] Admit date - 11/04/2022    Outpatient Primary MD for the patient is Kathryn Lung, MD  LOS - 1  days    Brief summary   Kathryn Myers is a 62 y.o. female with medical history significant for asthma and GERD who presents emergency department with shortness of breath, cough, and wheezing. She is admitted for acute asthma exacerbation. She initially required BIPAP    Assessment & Plan    Assessment and Plan:  Acute hypoxic respiratory failure secondary to acute asthma exacerbation:  Initially required BIPAP until yesterday.  Transition to [place] oxygen. To keep sats greater than 90%.  Continue with IV steroids, duonebs, .     Hypokalemia Replaced.    GERD;  On PPI and pepcid added.     Estimated body mass index is 15.72 kg/m as calculated from the following:   Height as of 09/06/22: '5\' 7"'$  (1.702 m).   Weight as of 10/09/22: 45.5 kg.  Code Status: full code.  DVT Prophylaxis:  SCDs Start: 11/05/22 0414   Level of Care: Level of care: Progressive Family Communication: none at bedside.   Disposition Plan:     Remains inpatient appropriate: IV solumedrol. Possible d/c in am.   Procedures:  None.   Consultants:   None.   Antimicrobials:   Anti-infectives (From admission, onward)    None        Medications  Scheduled Meds:  famotidine  20 mg Oral Daily   ipratropium-albuterol  3 mL Nebulization TID   [START ON 11/07/2022] methylPREDNISolone (SOLU-MEDROL) injection  60 mg Intravenous Daily   [START ON 11/07/2022] pantoprazole  40 mg Oral Daily   sodium chloride flush  3 mL Intravenous Q12H   Continuous Infusions: PRN Meds:.acetaminophen **OR** acetaminophen, albuterol, guaiFENesin, HYDROcodone-acetaminophen, hydrOXYzine, ondansetron **OR** ondansetron (ZOFRAN) IV,  senna-docusate    Subjective:   Kathryn Myers was seen and examined today.  Reports feeling much better.   Objective:   Vitals:   11/06/22 0519 11/06/22 0835 11/06/22 0837 11/06/22 1254  BP: 115/72   123/85  Pulse: 73   73  Resp: 20   18  Temp: 98.3 F (36.8 C)   98 F (36.7 C)  TempSrc: Oral   Oral  SpO2: 95% 94% 94% 96%    Intake/Output Summary (Last 24 hours) at 11/06/2022 1553 Last data filed at 11/05/2022 1600 Gross per 24 hour  Intake 480 ml  Output --  Net 480 ml   There were no vitals filed for this visit.   Exam General: Alert and oriented x 3, NAD Cardiovascular: S1 S2 auscultated, no murmurs, RRR Respiratory: scattered wheezing posteriorly.  Gastrointestinal: Soft, nontender, nondistended, + bowel sounds Ext: no pedal edema bilaterally Neuro: AAOx3, Cr N's II- XII. Strength 5/5 upper and lower extremities bilaterally Skin: No rashes Psych: Normal affect and demeanor, alert and oriented x3    Data Reviewed:  I have personally reviewed following labs and imaging studies  CBC Lab Results  Component Value Date   WBC 7.6 11/05/2022   RBC 3.78 (L) 11/05/2022   HGB 12.6 11/05/2022   HCT 37.3 11/05/2022   MCV 98.7 11/05/2022   MCH 33.3 11/05/2022   PLT 232 11/05/2022   MCHC 33.8 11/05/2022   RDW 12.9 11/05/2022   LYMPHSABS 4.3 (H) 11/04/2022   MONOABS 0.5 11/04/2022   EOSABS 1.1 (H) 11/04/2022   BASOSABS 0.1 50/53/9767     Last metabolic panel Lab Results  Component Value Date   NA 138 11/06/2022   K 4.4 11/06/2022   CL 108 11/06/2022   CO2 22 11/06/2022   BUN 17 11/06/2022   CREATININE 0.63 11/06/2022   GLUCOSE 163 (H) 11/06/2022   GFRNONAA >60 11/06/2022   GFRAA 79 11/08/2020   CALCIUM 8.7 (L) 11/06/2022   PROT 6.8 11/04/2022   ALBUMIN 3.9 11/04/2022   LABGLOB 2.9 11/08/2020   AGRATIO 1.7 11/08/2020   BILITOT 0.8 11/04/2022   ALKPHOS 62 11/04/2022   AST 30 11/04/2022   ALT 27 11/04/2022   ANIONGAP 8 11/06/2022    CBG (last 3)   No results for input(s): "GLUCAP" in the last 72 hours.    Coagulation Profile: No results for input(s): "INR", "PROTIME" in the last 168 hours.   Radiology Studies: CT Angio Chest PE W and/or Wo Contrast  Result Date: 11/05/2022 CLINICAL DATA:  Chest tightness and worsening shortness of breath. EXAM: CT ANGIOGRAPHY CHEST WITH CONTRAST TECHNIQUE: Multidetector CT imaging of the chest was performed using the standard protocol during bolus administration of intravenous contrast. Multiplanar CT image reconstructions and MIPs were obtained to evaluate the vascular anatomy. RADIATION DOSE REDUCTION: This exam was performed according to the departmental dose-optimization program which includes automated exposure control, adjustment of the mA and/or kV according to patient size and/or use of iterative reconstruction technique. CONTRAST:  32m OMNIPAQUE IOHEXOL 350 MG/ML SOLN COMPARISON:  December 07, 2020 FINDINGS: Cardiovascular: Satisfactory opacification of the pulmonary arteries to the segmental level. No evidence of pulmonary embolism. Normal heart size. No pericardial effusion. Mediastinum/Nodes: No enlarged mediastinal, hilar, or axillary lymph nodes. Thyroid gland, trachea, and esophagus demonstrate no significant findings. Lungs/Pleura: Very mild biapical scarring and/or atelectasis is seen. There is no evidence of acute infiltrate, pleural effusion or pneumothorax. Upper Abdomen: No acute abnormality. Musculoskeletal: No chest wall abnormality. No acute or significant osseous findings. Review of the MIP images confirms the above findings. IMPRESSION: 1. No evidence of pulmonary embolism or other acute intrathoracic process. 2. Very mild biapical scarring and/or atelectasis. Electronically Signed   By: TVirgina NorfolkM.D.   On: 11/05/2022 02:29   DG Chest Port 1 View  Result Date: 11/04/2022 CLINICAL DATA:  Respiratory distress with wheezing EXAM: PORTABLE CHEST 1 VIEW COMPARISON:  None  Available. FINDINGS: Hyperinflation. No acute airspace disease or effusion. Normal cardiac size. No pneumothorax IMPRESSION: Hyperinflation without focal airspace disease Electronically Signed   By: KDonavan FoilM.D.   On: 11/04/2022 22:43       VHosie PoissonM.D. [facility] 11/06/2022, 3:53 PM  Available via Epic secure chat 7am-7pm After 7 pm, please refer to night coverage provider listed on amion.

## 2022-11-06 NOTE — Plan of Care (Signed)
  Problem: Health Behavior/Discharge Planning: Goal: Ability to manage health-related needs will improve Outcome: Progressing   Problem: Clinical Measurements: Goal: Diagnostic test results will improve Outcome: Progressing   Problem: Safety: Goal: Ability to remain free from injury will improve Outcome: Progressing

## 2022-11-07 DIAGNOSIS — E876 Hypokalemia: Secondary | ICD-10-CM | POA: Diagnosis not present

## 2022-11-07 DIAGNOSIS — J9601 Acute respiratory failure with hypoxia: Secondary | ICD-10-CM | POA: Diagnosis not present

## 2022-11-07 DIAGNOSIS — K219 Gastro-esophageal reflux disease without esophagitis: Secondary | ICD-10-CM | POA: Diagnosis not present

## 2022-11-07 DIAGNOSIS — J4521 Mild intermittent asthma with (acute) exacerbation: Secondary | ICD-10-CM | POA: Diagnosis not present

## 2022-11-07 LAB — BASIC METABOLIC PANEL
Anion gap: 7 (ref 5–15)
BUN: 27 mg/dL — ABNORMAL HIGH (ref 8–23)
CO2: 23 mmol/L (ref 22–32)
Calcium: 8.4 mg/dL — ABNORMAL LOW (ref 8.9–10.3)
Chloride: 104 mmol/L (ref 98–111)
Creatinine, Ser: 0.85 mg/dL (ref 0.44–1.00)
GFR, Estimated: >60 mL/min (ref >60)
Glucose, Bld: 100 mg/dL — ABNORMAL HIGH (ref 70–99)
Potassium: 4 mmol/L (ref 3.5–5.1)
Sodium: 134 mmol/L — ABNORMAL LOW (ref 135–145)

## 2022-11-07 MED ORDER — IPRATROPIUM-ALBUTEROL 0.5-2.5 (3) MG/3ML IN SOLN: 3.0000 mL | Freq: Four times a day (QID) | RESPIRATORY_TRACT | 3 refills | Status: AC | PRN

## 2022-11-07 MED ORDER — FAMOTIDINE 20 MG PO TABS: 20.0000 mg | ORAL_TABLET | Freq: Every day | ORAL | 2 refills | Status: DC

## 2022-11-07 MED ORDER — AZITHROMYCIN 500 MG PO TABS: 500.0000 mg | ORAL_TABLET | Freq: Every day | ORAL | 0 refills | Status: AC

## 2022-11-07 MED ORDER — HYDROXYZINE HCL 25 MG PO TABS: 25.0000 mg | ORAL_TABLET | Freq: Three times a day (TID) | ORAL | 0 refills | Status: DC | PRN

## 2022-11-07 MED ORDER — PREDNISONE 10 MG PO TABS: 40.0000 mg | ORAL_TABLET | Freq: Every day | ORAL | 0 refills | Status: AC

## 2022-11-07 MED ORDER — GUAIFENESIN 100 MG/5ML PO LIQD: 10.0000 mL | ORAL | 0 refills | Status: DC | PRN

## 2022-11-07 NOTE — Progress Notes (Signed)
The patient is alert, oriented x4.ambulatory without assistance.  Discharge instructions reviewed. Questions, concerns were denied at this time. Pt awaiting arrival of nebulizer machine.

## 2022-11-07 NOTE — TOC Transition Note (Signed)
Transition of Care [facility]) - CM/SW Discharge Note   Patient Details  Name: Kathryn Myers MRN: [id number] Date of Birth: [birth year]  [facility] of Care [facility]) CM/SW Contact:  Illene Regulus, LCSW Phone Number: 11/07/2022, 12:36 PM   Clinical Narrative:    CSW spoke to pt about reccommended DME , no company preference. CSW made to [facility] for neb machine. Machine will be delivered to pt's room prior to d/c. No additional TOC needs.  Final next level of care: Home/Self Care Barriers to Discharge: ED DME delivery   Patient Goals and CMS Choice CMS Medicare.gov Compare Post Acute Care list provided to:: Patient Choice offered to / list presented to : Patient  Discharge Placement                         Discharge Plan and Services Additional resources added to the After Visit Summary for   In-house Referral: Clinical Social Work              DME Arranged: [facility] DME Agency: [employer] Date DME Agency Contacted: 11/07/22 Time DME Agency Contacted: 1115 Representative spoke with at DME Agency: jermaine            Social Determinants of Health ([place]) Interventions [place]: No Food Insecurity (11/05/2022)  Housing: Low Risk  (11/05/2022)  Transportation Needs: No Transportation Needs (11/05/2022)  Utilities: Not At Risk (11/05/2022)  Depression (PHQ2-9): Low Risk  (08/08/2022)  Tobacco Use: Medium Risk (11/05/2022)     Readmission Risk Interventions     No data to display

## 2022-11-08 ENCOUNTER — Telehealth: Payer: Self-pay

## 2022-11-08 NOTE — Telephone Encounter (Signed)
Transition Care Management Follow-up Telephone Call Date of discharge and from where: [facility] 11/07/2022 How have you been since you were released from the hospital? better Any questions or concerns? No  Items Reviewed: Did the pt receive and understand the discharge instructions provided? Yes  Medications obtained and verified? Yes  Other? No  Any new allergies since your discharge? No  Dietary orders reviewed? Yes Do you have support at home? Yes   Home Care and Equipment/Supplies: Were home health services ordered? no If so, what is the name of the agency? N/a  Has the agency set up a time to come to the patient's home? no Were any new equipment or medical supplies ordered?  No What is the name of the medical supply agency? N/a Were you able to get the supplies/equipment? not applicable Do you have any questions related to the use of the equipment or supplies? No  Functional Questionnaire: (I = Independent and D = Dependent) ADLs: I  Bathing/Dressing- I  Meal Prep- I  Eating- I  Maintaining continence- I  Transferring/Ambulation- I  Managing Meds- I  Follow up appointments reviewed:  PCP Hospital f/u appt confirmed? No  Patient declined [facility] f/u appt confirmed? Yes  Scheduled to see Dr Tressa Busman on 11/29/2022 @ 3:00. Are transportation arrangements needed? No  If their condition worsens, is the pt aware to call PCP or go to the Emergency Dept.? Yes Was the patient provided with contact information for the PCP's office or ED? Yes Was to pt encouraged to call back with  .lhquestions or concerns? Yes  Juanda Crumble, LPN [place] [facility] [phone]

## 2022-11-08 NOTE — Discharge Summary (Signed)
Physician Discharge Summary   Patient: Kathryn Myers MRN: [id number] DOB: [birth year]  Admit date:     11/04/2022  Discharge date: 11/07/2022  Discharge Physician: Hosie Poisson   PCP: Denita Lung, MD   Recommendations at discharge:  Please follow up with PCp in one week.  Please follow up with pulmonology for PFT'S Please follow up with GI for possible EGD.    Discharge Diagnoses: Principal Problem:   Acute asthma exacerbation Active Problems:   Hypokalemia   Acute respiratory failure with hypoxia [facility])    Hospital Course: Kathryn Myers is a 62 y.o. female with medical history significant for asthma and GERD who presents emergency department with shortness of breath, cough, and wheezing. She is admitted for acute asthma exacerbation. She initially required BIPAP , but weaned off . Currently she is on RA and in good spirits.   Assessment and Plan:   Acute hypoxic respiratory failure secondary to acute asthma exacerbation:  Initially required BIPAP , transitioned to [place] oxygen and to RA.  She ws started on IV steroids, transitioned to oral prednisone on discharge. She will continue with duonebs and zithromax for acute bronchitis and complete the course.        Hypokalemia Replaced.      GERD;  On PPI and pepcid added.  She feels her GERD is the reason she is having repeated asthma attacks.  Referral sent to [facility] for possible EGD.        Estimated body mass index is 15.72 kg/m as calculated from the following:   Height as of 09/06/22: '5\' 7"'$  (1.702 m).   Weight as of 10/09/22: 45.5 kg      Consultants: none.  Procedures performed: none.   Disposition: Home Diet recommendation:  Discharge Diet Orders (From admission, onward)     Start     Ordered   11/07/22 0000  Diet - low sodium heart healthy        11/07/22 0953           Regular diet DISCHARGE MEDICATION: Allergies as of 11/07/2022   No Known Allergies      Medication List     TAKE these  medications    albuterol 108 (90 Base) MCG/ACT inhaler Commonly known as: VENTOLIN HFA Inhale 2 puffs into the lungs every 6 (six) hours as needed for wheezing or shortness of breath.   azithromycin 500 MG tablet Commonly known as: Zithromax Take 1 tablet (500 mg total) by mouth daily for 3 days. Take 1 tablet daily for 3 days.   B-complex with vitamin C tablet Take 1 tablet by mouth daily.   Calcium Carbonate-Vitamin D 600-200 MG-UNIT Tabs Take 2 tablets by mouth daily.   docusate sodium 100 MG capsule Commonly known as: COLACE Take 100 mg by mouth daily as needed for mild constipation.   famotidine 20 MG tablet Commonly known as: PEPCID Take 1 tablet (20 mg total) by mouth daily.   guaiFENesin 100 MG/5ML liquid Commonly known as: ROBITUSSIN Take 10 mLs by mouth every 4 (four) hours as needed for cough or to loosen phlegm.   hydrOXYzine 25 MG tablet Commonly known as: ATARAX Take 1 tablet (25 mg total) by mouth 3 (three) times daily as needed for anxiety.   ipratropium-albuterol 0.5-2.5 (3) MG/3ML Soln Commonly known as: DUONEB Take 3 mLs by nebulization every 6 (six) hours as needed.   MULTIVITAMIN ADULT PO Take 1 tablet by mouth daily.   pantoprazole 40 MG tablet Commonly known as:  PROTONIX Take 1 tablet (40 mg total) by mouth 2 (two) times daily.   predniSONE 10 MG tablet Commonly known as: DELTASONE Take 4 tablets (40 mg total) by mouth daily for 5 days.   SALMON OIL PO Take 1 capsule by mouth daily.               Durable Medical Equipment  (From admission, onward)           Start     Ordered   11/07/22 0000  For home use only DME Nebulizer machine       Question Answer Comment  Patient needs a nebulizer to treat with the following condition Acute asthma exacerbation   Length of Need Lifetime      11/07/22 0953            Follow-up Information     [facility], [place] Follow up.   [facility] [phone] contact for any  issues with [facility] machine Contact information: [address] [place] [address] [phone]                Discharge Exam: Filed Weights   11/06/22 2200  Weight: 44.1 kg   General exam: Appears calm and comfortable  Respiratory system: Clear to auscultation. Respiratory effort normal. Cardiovascular system: S1 & S2 heard, RRR. No JVD, murmurs, rubs, gallops or clicks. No pedal edema. Gastrointestinal system: Abdomen is nondistended, soft and nontender. No organomegaly or masses felt. Normal bowel sounds heard. Central nervous system: Alert and oriented. No focal neurological deficits. Extremities: Symmetric 5 x 5 power. Skin: No rashes, lesions or ulcers Psychiatry: Judgement and insight appear normal. Mood & affect appropriate.    Condition at discharge: fair  The results of significant diagnostics from this hospitalization (including imaging, microbiology, ancillary and laboratory) are listed below for reference.   Imaging Studies: CT Angio Chest PE W and/or Wo Contrast  Result Date: 11/05/2022 CLINICAL DATA:  Chest tightness and worsening shortness of breath. EXAM: CT ANGIOGRAPHY CHEST WITH CONTRAST TECHNIQUE: Multidetector CT imaging of the chest was performed using the standard protocol during bolus administration of intravenous contrast. Multiplanar CT image reconstructions and MIPs were obtained to evaluate the vascular anatomy. RADIATION DOSE REDUCTION: This exam was performed according to the departmental dose-optimization program which includes automated exposure control, adjustment of the mA and/or kV according to patient size and/or use of iterative reconstruction technique. CONTRAST:  30m OMNIPAQUE IOHEXOL 350 MG/ML SOLN COMPARISON:  December 07, 2020 FINDINGS: Cardiovascular: Satisfactory opacification of the pulmonary arteries to the segmental level. No evidence of pulmonary embolism. Normal heart size. No pericardial effusion. Mediastinum/Nodes: No enlarged  mediastinal, hilar, or axillary lymph nodes. Thyroid gland, trachea, and esophagus demonstrate no significant findings. Lungs/Pleura: Very mild biapical scarring and/or atelectasis is seen. There is no evidence of acute infiltrate, pleural effusion or pneumothorax. Upper Abdomen: No acute abnormality. Musculoskeletal: No chest wall abnormality. No acute or significant osseous findings. Review of the MIP images confirms the above findings. IMPRESSION: 1. No evidence of pulmonary embolism or other acute intrathoracic process. 2. Very mild biapical scarring and/or atelectasis. Electronically Signed   By: TVirgina NorfolkM.D.   On: 11/05/2022 02:29   DG Chest Port 1 View  Result Date: 11/04/2022 CLINICAL DATA:  Respiratory distress with wheezing EXAM: PORTABLE CHEST 1 VIEW COMPARISON:  None Available. FINDINGS: Hyperinflation. No acute airspace disease or effusion. Normal cardiac size. No pneumothorax IMPRESSION: Hyperinflation without focal airspace disease Electronically Signed   By: KMadie RenoD.  On: 11/04/2022 22:43    Microbiology: Results for orders placed or performed during the hospital encounter of 11/04/22  Resp panel by RT-PCR (RSV, Flu A&B, Covid) Anterior Nasal Swab     Status: None   Collection Time: 11/04/22 10:36 PM   Specimen: Anterior Nasal Swab  Result Value Ref Range Status   SARS Coronavirus 2 by RT PCR NEGATIVE NEGATIVE Final    Comment: (NOTE) SARS-CoV-2 target nucleic acids are NOT DETECTED.  The SARS-CoV-2 RNA is generally detectable in upper respiratory specimens during the acute phase of infection. The lowest concentration of SARS-CoV-2 viral copies this assay can detect is 138 copies/mL. A negative result does not preclude SARS-Cov-2 infection and should not be used as the sole basis for treatment or other patient management decisions. A negative result may occur with  improper specimen collection/handling, submission of specimen other than nasopharyngeal  swab, presence of viral mutation(s) within the areas targeted by this assay, and inadequate number of viral copies(<138 copies/mL). A negative result must be combined with clinical observations, patient history, and epidemiological information. The expected result is Negative.  Fact Sheet for Patients:  EntrepreneurPulse.com.au  Fact Sheet for Healthcare Providers:  IncredibleEmployment.be  This test is no t yet approved or cleared by the [place] FDA and  has been authorized for detection and/or diagnosis of SARS-CoV-2 by FDA under an Emergency Use Authorization (EUA). This EUA will remain  in effect (meaning this test can be used) for the duration of the COVID-19 declaration under Section 564(b)(1) of the Act, 21 U.S.C.section 360bbb-3(b)(1), unless the authorization is terminated  or revoked sooner.       Influenza A by PCR NEGATIVE NEGATIVE Final   Influenza B by PCR NEGATIVE NEGATIVE Final    Comment: (NOTE) The Xpert Xpress SARS-CoV-2/FLU/RSV plus assay is intended as an aid in the diagnosis of influenza from Nasopharyngeal swab specimens and should not be used as a sole basis for treatment. Nasal washings and aspirates are unacceptable for Xpert Xpress SARS-CoV-2/FLU/RSV testing.  Fact Sheet for Patients: EntrepreneurPulse.com.au  Fact Sheet for Healthcare Providers: IncredibleEmployment.be  This test is not yet approved or cleared by the [place] FDA and has been authorized for detection and/or diagnosis of SARS-CoV-2 by FDA under an Emergency Use Authorization (EUA). This EUA will remain in effect (meaning this test can be used) for the duration of the COVID-19 declaration under Section 564(b)(1) of the Act, 21 U.S.C. section 360bbb-3(b)(1), unless the authorization is terminated or revoked.     Resp Syncytial Virus by PCR NEGATIVE NEGATIVE Final    Comment: (NOTE) Fact Sheet for  Patients: EntrepreneurPulse.com.au  Fact Sheet for Healthcare Providers: IncredibleEmployment.be  This test is not yet approved or cleared by the [place] FDA and has been authorized for detection and/or diagnosis of SARS-CoV-2 by FDA under an Emergency Use Authorization (EUA). This EUA will remain in effect (meaning this test can be used) for the duration of the COVID-19 declaration under Section 564(b)(1) of the Act, 21 U.S.C. section 360bbb-3(b)(1), unless the authorization is terminated or revoked.  Performed at [facility], [place] [address], [place], [place] [address]     Labs: CBC: Recent Labs  Lab 11/04/22 2216 11/05/22 0833  WBC 11.6* 7.6  NEUTROABS 5.6  --   HGB 14.8 12.6  HCT 43.7 37.3  MCV 99.1 98.7  PLT 285 825   Basic Metabolic Panel: Recent Labs  Lab 11/04/22 2356 11/05/22 0833 11/06/22 0432 11/07/22 0404  NA 139 138 138  134*  K 2.9* 5.1 4.4 4.0  CL 104 108 108 104  CO2 '24 23 22 23  '$ GLUCOSE 167* 244* 163* 100*  BUN '16 16 17 '$ 27*  CREATININE 0.66 0.85 0.63 0.85  CALCIUM 8.7* 8.5* 8.7* 8.4*  MG  --  2.3  --   --    Liver Function Tests: Recent Labs  Lab 11/04/22 2356  AST 30  ALT 27  ALKPHOS 62  BILITOT 0.8  PROT 6.8  ALBUMIN 3.9   CBG: No results for input(s): "GLUCAP" in the last 168 hours.  Discharge time spent: 39 minutes.   Signed: Hosie Poisson, MD [facility]

## 2022-11-28 NOTE — Progress Notes (Deleted)
Synopsis: Referred for chronic cough by Hosie Poisson, MD  Subjective:   PATIENT ID: Kathryn Myers GENDER: female DOB: 02-02-1961, MRN: [id number]  No chief complaint on file.  62yF with history of asthma, AR, recent admission for asthma exac requiring BiPAP, discharged 11/07/22  Seen by ENT 1/10 for chronic cough ramped up reflux tx though normal FNL  Has esophagela manometry planned 04/04/23  Otherwise pertinent review of systems is negative.  Past Medical History:  Diagnosis Date   Allergic rhinitis    Femoral hernia of right side    Hemorrhoids    Hx of abnormal cervical Pap smear    Osteopenia 11/2012   repeat in 3-5 years   Venereal disease contact    uses condoms, husband is HIV+   Wears glasses      Family History  Problem Relation Age of Onset   Hyperlipidemia Mother    Osteoporosis Mother    Parkinsonism Father    Hyperlipidemia Father    Hypertension Father    Heart disease Father    Cancer Brother        bone cancer as a child   Cancer Paternal Uncle        pancreatic   Heart disease Maternal Grandfather    Osteoporosis Maternal Grandmother    Breast cancer Maternal Aunt 80   Stroke Neg Hx    Diabetes Neg Hx      Past Surgical History:  Procedure Laterality Date   COLONOSCOPY  12/2009   Dr. Benson [place]; sessile polyp, medium hemorrhoids, diverticulum   FEMORAL HERNIA REPAIR Right 10/22/2018   Procedure: RIGHT FEMORAL HERNIA REPAIR WITH MESH;  Surgeon: Donnie Mesa, MD;  Location: [facility];  Service: General;  Laterality: Right;   INGUINAL HERNIA REPAIR  2008   left   INSERTION OF MESH Right 10/22/2018   Procedure: INSERTION OF MESH;  Surgeon: Donnie Mesa, MD;  Location: [facility];  Service: General;  Laterality: Right;   SKIN BIOPSY Left 10/20/2021   compond nevus with moderate atypia    Social History   Socioeconomic History   Marital status: Married    Spouse name: Not on file   Number of children: Not on  file   Years of education: Not on file   Highest education level: Not on file  Occupational History   Occupation: Hydrographic surveyor    Employer: [employer]  Tobacco Use   Smoking status: Former    Packs/day: 0.25    Years: 10.00    Total pack years: 2.50    Types: Cigarettes    Quit date: 10/27/1991    Years since quitting: 31.1   Smokeless tobacco: Never  Vaping Use   Vaping Use: Never used  Substance and Sexual Activity   Alcohol use: Yes    Alcohol/week: 7.0 standard drinks of alcohol    Types: 7 Glasses of wine per week    Comment: social   Drug use: No   Sexual activity: Yes    Birth control/protection: Post-menopausal    Comment: 1st intercourse 69 yo-5 partners  Other Topics Concern   Not on file  Social History Narrative   Married, no children, exercise - walks the dog, travels a lot for work, 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  Travels to [place], [place], [place].     Social Determinants of Health   Financial Resource Strain: Not on file  Food Insecurity: No Food Insecurity (11/05/2022)   Hunger  Vital Sign    Worried About Charity fundraiser in the Last Year: Never true    Ran Out of Food in the Last Year: Never true  Transportation Needs: No Transportation Needs (11/05/2022)   PRAPARE - Hydrologist (Medical): No    Lack of Transportation (Non-Medical): No  Physical Activity: Not on file  Stress: Not on file  Social Connections: Not on file  Intimate Partner Violence: Not At Risk (11/05/2022)   Humiliation, Afraid, Rape, and Kick questionnaire    Fear of Current or Ex-Partner: No    Emotionally Abused: No    Physically Abused: No    Sexually Abused: No     No Known Allergies   Outpatient Medications Prior to Visit  Medication Sig Dispense Refill   albuterol (VENTOLIN HFA) 108 (90 Base) MCG/ACT inhaler Inhale 2 puffs into the lungs every 6 (six) hours as needed for wheezing  or shortness of breath. 8 g 0   B Complex-C (B-COMPLEX WITH VITAMIN C) tablet Take 1 tablet by mouth daily.     Calcium Carbonate-Vitamin D 600-200 MG-UNIT TABS Take 2 tablets by mouth daily.     docusate sodium (COLACE) 100 MG capsule Take 100 mg by mouth daily as needed for mild constipation.     famotidine (PEPCID) 20 MG tablet Take 1 tablet (20 mg total) by mouth daily. 30 tablet 2   guaiFENesin (ROBITUSSIN) 100 MG/5ML liquid Take 10 mLs by mouth every 4 (four) hours as needed for cough or to loosen phlegm. 120 mL 0   hydrOXYzine (ATARAX) 25 MG tablet Take 1 tablet (25 mg total) by mouth 3 (three) times daily as needed for anxiety. (Patient not taking: Reported on 11/08/2022) 30 tablet 0   ipratropium-albuterol (DUONEB) 0.5-2.5 (3) MG/3ML SOLN Take 3 mLs by nebulization every 6 (six) hours as needed. 360 mL 3   Multiple Vitamin (MULTIVITAMIN ADULT PO) Take 1 tablet by mouth daily.     Omega-3 Fatty Acids (SALMON OIL PO) Take 1 capsule by mouth daily.     pantoprazole (PROTONIX) 40 MG tablet Take 1 tablet (40 mg total) by mouth 2 (two) times daily. 60 tablet 2   No facility-administered medications prior to visit.       Objective:   Physical Exam:  General appearance: 62 y.o., female, NAD, conversant  Eyes: anicteric sclerae; PERRL, tracking appropriately HENT: NCAT; MMM Neck: Trachea midline; no lymphadenopathy, no JVD Lungs: CTAB, no crackles, no wheeze, with normal respiratory effort CV: RRR, no murmur  Abdomen: Soft, non-tender; non-distended, BS present  Extremities: No peripheral edema, warm Skin: Normal turgor and texture; no rash Psych: Appropriate affect Neuro: Alert and oriented to person and place, no focal deficit     There were no vitals filed for this visit.   on *** LPM *** RA BMI Readings from Last 3 Encounters:  11/06/22 15.22 kg/m  10/09/22 15.72 kg/m  09/06/22 15.35 kg/m   Wt Readings from Last 3 Encounters:  11/06/22 97 lb 3.2 oz (44.1 kg)   10/09/22 100 lb 6.4 oz (45.5 kg)  09/06/22 98 lb (44.5 kg)     CBC    Component Value Date/Time   WBC 7.6 11/05/2022 0833   RBC 3.78 (L) 11/05/2022 0833   HGB 12.6 11/05/2022 0833   HGB 15.5 11/08/2020 1234   HCT 37.3 11/05/2022 0833   HCT 45.7 11/08/2020 1234   PLT 232 11/05/2022 0833   PLT 272 11/08/2020 1234   MCV 98.7  11/05/2022 0833   MCV 96 11/08/2020 1234   MCH 33.3 11/05/2022 0833   MCHC 33.8 11/05/2022 0833   RDW 12.9 11/05/2022 0833   RDW 12.3 11/08/2020 1234   LYMPHSABS 4.3 (H) 11/04/2022 2216   LYMPHSABS 2.6 11/08/2020 1234   MONOABS 0.5 11/04/2022 2216   EOSABS 1.1 (H) 11/04/2022 2216   EOSABS 0.2 11/08/2020 1234   BASOSABS 0.1 11/04/2022 2216   BASOSABS 0.1 11/08/2020 1234    Eos 1.1 11/04/22  Chest Imaging: CTA Chest 11/05/22 reviewed by me unremarkable  Pulmonary Functions Testing Results:     No data to display           Echocardiogram 2022:    1. Left ventricular ejection fraction, by estimation, is 60 to 65%. The  left ventricle has normal function. The left ventricle has no regional  wall motion abnormalities. Left ventricular diastolic parameters were  normal.   2. Right ventricular systolic function is normal. The right ventricular  size is normal.   3. The mitral valve is normal in structure. Trivial mitral valve  regurgitation. No evidence of mitral stenosis.   4. The aortic valve is tricuspid. Aortic valve regurgitation is not  visualized. Mild aortic valve sclerosis is present, with no evidence of  aortic valve stenosis.   5. The inferior vena cava is normal in size with greater than 50%  respiratory variability, suggesting right atrial pressure of 3 mmHg.    Heart Catheterization: ***    Assessment & Plan:    Plan:      Maryjane Hurter, MD [facility] 11/28/2022 4:56 PM

## 2022-11-29 ENCOUNTER — Institutional Professional Consult (permissible substitution): Payer: 59 | Admitting: Student

## 2022-12-01 ENCOUNTER — Ambulatory Visit: Payer: 59 | Admitting: Student

## 2022-12-01 ENCOUNTER — Encounter: Payer: Self-pay | Admitting: Student

## 2022-12-01 VITALS — BP 112/64 | HR 68 | Ht 67.0 in | Wt 98.0 lb

## 2022-12-01 DIAGNOSIS — J329 Chronic sinusitis, unspecified: Secondary | ICD-10-CM

## 2022-12-01 DIAGNOSIS — R053 Chronic cough: Secondary | ICD-10-CM | POA: Diagnosis not present

## 2022-12-01 MED ORDER — FLUTICASONE PROPIONATE 50 MCG/ACT NA SUSP: 1.0000 | Freq: Every day | NASAL | 11 refills | Status: DC

## 2022-12-01 MED ORDER — BREZTRI AEROSPHERE 160-9-4.8 MCG/ACT IN AERO: 2.0000 | INHALATION_SPRAY | Freq: Two times a day (BID) | RESPIRATORY_TRACT | 0 refills | Status: DC

## 2022-12-01 MED ORDER — PREDNISONE 10 MG PO TABS: ORAL_TABLET | ORAL | 0 refills | Status: DC

## 2022-12-01 MED ORDER — BREZTRI AEROSPHERE 160-9-4.8 MCG/ACT IN AERO: 2.0000 | INHALATION_SPRAY | Freq: Two times a day (BID) | RESPIRATORY_TRACT | 11 refills | Status: DC

## 2022-12-01 NOTE — Patient Instructions (Addendum)
-  breztri 2 puffs twice daily rinse mouth, brush teeth/tongue after each use - albuterol 1-2 puffs as needed this is your rescue inhaler - try flutter valve 10 slow but firm puffs twice daily   - start flonase - 1 spray on each side of your nose twice a day for first week, then 1 spray on each side.   Instructions for use: If you also use a saline nasal spray or rinse, use that first. Or take shower and clear nose of crusting first.  Position the head with the chin slightly tucked. Use the right hand to spray into the left nostril and the right hand to spray into the left nostril.   Point the bottle away from the septum of your nose (cartilage that divides the two sides of your nose).  Hold the nostril closed on the opposite side from where you will spray Spray once and gently sniff to pull the medicine into the higher parts of your nose.  Don't sniff too hard as the medicine will drain down the back of your throat instead. Repeat with a second spray on the same side if prescribed. Repeat on the other side of your nose. - Breathing tests and repeat bloodwork on same day as next visit in 8 weeks

## 2022-12-01 NOTE — Progress Notes (Signed)
Synopsis: Referred for chronic cough by Denita Lung, MD  Subjective:   PATIENT ID: Kathryn Myers GENDER: female DOB: 26-Apr-1961, MRN: [id number]  Chief Complaint  Patient presents with   Consult   62yF with history of asthma, AR, GERD recent admission for asthma exac requiring BiPAP, discharged 11/07/22  Seen by ENT 1/10 for chronic cough ramped up reflux tx though normal FNL  Had covid 11/2021, 2020. Was not hospitalized either time.   October, Nov, December 2023 were rough months for her.   She does feel like she's beginning to get back to baseline since her hospitalization though over last couple of days has developed worsening sinonasal congestion, productive cough. Increased DOE over the fall and had to use rescue inhaler a lot. Has not had courses of steroids until this year.   No dysphagia but does have esophageal manometry planned 04/04/23  Otherwise pertinent review of systems is negative.  She has no family history of lung disease  She works in Catering manager. Smoked for 15 years < half ppd. Very limited MJ use, no vaping. She has never lived outside of [place]. No recent travel.   Past Medical History:  Diagnosis Date   Allergic rhinitis    Femoral hernia of right side    Hemorrhoids    Hx of abnormal cervical Pap smear    Osteopenia 11/2012   repeat in 3-5 years   Venereal disease contact    uses condoms, husband is HIV+   Wears glasses      Family History  Problem Relation Age of Onset   Hyperlipidemia Mother    Osteoporosis Mother    Parkinsonism Father    Hyperlipidemia Father    Hypertension Father    Heart disease Father    Cancer Brother        bone cancer as a child   Cancer Paternal Uncle        pancreatic   Heart disease Maternal Grandfather    Osteoporosis Maternal Grandmother    Breast cancer Maternal Aunt 80   Stroke Neg Hx    Diabetes Neg Hx      Past Surgical History:  Procedure Laterality Date   COLONOSCOPY   12/2009   Dr. Benson [place]; sessile polyp, medium hemorrhoids, diverticulum   FEMORAL HERNIA REPAIR Right 10/22/2018   Procedure: RIGHT FEMORAL HERNIA REPAIR WITH MESH;  Surgeon: Donnie Mesa, MD;  Location: [facility];  Service: General;  Laterality: Right;   INGUINAL HERNIA REPAIR  2008   left   INSERTION OF MESH Right 10/22/2018   Procedure: INSERTION OF MESH;  Surgeon: Donnie Mesa, MD;  Location: [facility];  Service: General;  Laterality: Right;   SKIN BIOPSY Left 10/20/2021   compond nevus with moderate atypia    Social History   Socioeconomic History   Marital status: Married    Spouse name: Not on file   Number of children: Not on file   Years of education: Not on file   Highest education level: Not on file  Occupational History   Occupation: Hydrographic surveyor    Employer: [employer]  Tobacco Use   Smoking status: Former    Packs/day: 0.25    Years: 10.00    Total pack years: 2.50    Types: Cigarettes    Quit date: 10/27/1991    Years since quitting: 31.1   Smokeless tobacco: Never  Vaping Use   Vaping Use: Never used  Substance and Sexual Activity  Alcohol use: Yes    Alcohol/week: 7.0 standard drinks of alcohol    Types: 7 Glasses of wine per week    Comment: social   Drug use: No   Sexual activity: Yes    Birth control/protection: Post-menopausal    Comment: 1st intercourse 81 yo-5 partners  Other Topics Concern   Not on file  Social History Narrative   Married, no children, exercise - walks the dog, travels a lot for work, 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  Travels to [place], [place], [place].     Social Determinants of Health   Financial Resource Strain: Not on file  Food Insecurity: No Food Insecurity (11/05/2022)   Hunger Vital Sign    Worried About Running Out of Food in the Last Year: Never true    Ran Out of Food in the Last Year: Never true  Transportation Needs: No  Transportation Needs (11/05/2022)   PRAPARE - Hydrologist (Medical): No    Lack of Transportation (Non-Medical): No  Physical Activity: Not on file  Stress: Not on file  Social Connections: Not on file  Intimate Partner Violence: Not At Risk (11/05/2022)   Humiliation, Afraid, Rape, and Kick questionnaire    Fear of Current or Ex-Partner: No    Emotionally Abused: No    Physically Abused: No    Sexually Abused: No     No Known Allergies   Outpatient Medications Prior to Visit  Medication Sig Dispense Refill   albuterol (VENTOLIN HFA) 108 (90 Base) MCG/ACT inhaler Inhale 2 puffs into the lungs every 6 (six) hours as needed for wheezing or shortness of breath. 8 g 0   B Complex-C (B-COMPLEX WITH VITAMIN C) tablet Take 1 tablet by mouth daily.     Calcium Carbonate-Vitamin D 600-200 MG-UNIT TABS Take 2 tablets by mouth daily.     docusate sodium (COLACE) 100 MG capsule Take 100 mg by mouth daily as needed for mild constipation.     famotidine (PEPCID) 20 MG tablet Take 1 tablet (20 mg total) by mouth daily. (Patient taking differently: Take 40 mg by mouth daily.) 30 tablet 2   guaiFENesin (ROBITUSSIN) 100 MG/5ML liquid Take 10 mLs by mouth every 4 (four) hours as needed for cough or to loosen phlegm. 120 mL 0   ipratropium-albuterol (DUONEB) 0.5-2.5 (3) MG/3ML SOLN Take 3 mLs by nebulization every 6 (six) hours as needed. 360 mL 3   Multiple Vitamin (MULTIVITAMIN ADULT PO) Take 1 tablet by mouth daily.     Omega-3 Fatty Acids (SALMON OIL PO) Take 1 capsule by mouth daily.     pantoprazole (PROTONIX) 40 MG tablet Take 1 tablet (40 mg total) by mouth 2 (two) times daily. 60 tablet 2   hydrOXYzine (ATARAX) 25 MG tablet Take 1 tablet (25 mg total) by mouth 3 (three) times daily as needed for anxiety. (Patient not taking: Reported on 11/08/2022) 30 tablet 0   No facility-administered medications prior to visit.       Objective:   Physical Exam:  General  appearance: 62 y.o., female, NAD, conversant  Eyes: anicteric sclerae; PERRL, tracking appropriately HENT: NCAT; MMM Neck: Trachea midline; no lymphadenopathy, no JVD Lungs: CTAB, no crackles, no wheeze, with normal respiratory effort CV: RRR, no murmur  Abdomen: Soft, non-tender; non-distended, BS present  Extremities: No peripheral edema, warm Skin: Normal turgor and texture; no rash Psych: Appropriate affect Neuro: Alert and oriented to person and place, no  focal deficit     Vitals:   12/01/22 1301  BP: 112/64  Pulse: 68  SpO2: 99%  Weight: 98 lb (44.5 kg)  Height: '5\' 7"'$  (1.702 m)   99% on RA BMI Readings from Last 3 Encounters:  12/01/22 15.35 kg/m  11/06/22 15.22 kg/m  10/09/22 15.72 kg/m   Wt Readings from Last 3 Encounters:  12/01/22 98 lb (44.5 kg)  11/06/22 97 lb 3.2 oz (44.1 kg)  10/09/22 100 lb 6.4 oz (45.5 kg)     CBC    Component Value Date/Time   WBC 7.6 11/05/2022 0833   RBC 3.78 (L) 11/05/2022 0833   HGB 12.6 11/05/2022 0833   HGB 15.5 11/08/2020 1234   HCT 37.3 11/05/2022 0833   HCT 45.7 11/08/2020 1234   PLT 232 11/05/2022 0833   PLT 272 11/08/2020 1234   MCV 98.7 11/05/2022 0833   MCV 96 11/08/2020 1234   MCH 33.3 11/05/2022 0833   MCHC 33.8 11/05/2022 0833   RDW 12.9 11/05/2022 0833   RDW 12.3 11/08/2020 1234   LYMPHSABS 4.3 (H) 11/04/2022 2216   LYMPHSABS 2.6 11/08/2020 1234   MONOABS 0.5 11/04/2022 2216   EOSABS 1.1 (H) 11/04/2022 2216   EOSABS 0.2 11/08/2020 1234   BASOSABS 0.1 11/04/2022 2216   BASOSABS 0.1 11/08/2020 1234    Eos 1.1 11/04/22  Chest Imaging: CTA Chest 11/05/22 reviewed by me unremarkable  Pulmonary Functions Testing Results:     No data to display           Echocardiogram 2022:    1. Left ventricular ejection fraction, by estimation, is 60 to 65%. The  left ventricle has normal function. The left ventricle has no regional  wall motion abnormalities. Left ventricular diastolic parameters were   normal.   2. Right ventricular systolic function is normal. The right ventricular  size is normal.   3. The mitral valve is normal in structure. Trivial mitral valve  regurgitation. No evidence of mitral stenosis.   4. The aortic valve is tricuspid. Aortic valve regurgitation is not  visualized. Mild aortic valve sclerosis is present, with no evidence of  aortic valve stenosis.   5. The inferior vena cava is normal in size with greater than 50%  respiratory variability, suggesting right atrial pressure of 3 mmHg.       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

## 2022-12-12 DIAGNOSIS — K449 Diaphragmatic hernia without obstruction or gangrene: Secondary | ICD-10-CM | POA: Insufficient documentation

## 2022-12-26 ENCOUNTER — Encounter: Payer: Self-pay | Admitting: Student

## 2022-12-27 ENCOUNTER — Other Ambulatory Visit: Payer: Self-pay | Admitting: Family Medicine

## 2022-12-27 DIAGNOSIS — K219 Gastro-esophageal reflux disease without esophagitis: Secondary | ICD-10-CM

## 2022-12-27 NOTE — Telephone Encounter (Signed)
Dr. Verlee Monte, please see mychart messages sent by pt and advise.

## 2023-01-24 NOTE — Progress Notes (Signed)
Synopsis: Referred for chronic cough by Denita Lung, MD  Subjective:   PATIENT ID: Kathryn Myers GENDER: female DOB: 05-10-1961, MRN: [id number]  No chief complaint on file.  61yF with history of asthma, AR, GERD recent admission for asthma exac requiring BiPAP, discharged 11/07/22  Seen by ENT 1/10 for chronic cough ramped up reflux tx though normal FNL  Had covid 11/2021, 2020. Was not hospitalized either time.   October, Nov, December 2023 were rough months for her.   She does feel like she's beginning to get back to baseline since her hospitalization though over last couple of days has developed worsening sinonasal congestion, productive cough. Increased DOE over the fall and had to use rescue inhaler a lot. Has not had courses of steroids until this year.   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 Very limited MJ use, no vaping. She has never lived outside of [place]. No recent travel.   Interval HPI  Started on breztri last viist, flutter, flonase, given course of prednisone in case of emergency before ski trip  Found to have candida esophagitis after last visit on EGD  PFTs with mild obstruction and excellent bronchodilator response  Still using breztri 2 puffs twice daily. Feels some hoarseness worse over course of day now that she's on it.   Still has a good deal of sinus congestion.   Otherwise pertinent review of systems is negative.  Past Medical History:  Diagnosis Date   Allergic rhinitis    Femoral hernia of right side    Hemorrhoids    Hx of abnormal cervical Pap smear    Osteopenia 11/2012   repeat in 3-5 years   Venereal disease contact    uses condoms, husband is HIV+   Wears glasses      Family History  Problem Relation Age of Onset   Hyperlipidemia Mother    Osteoporosis Mother    Parkinsonism  Father    Hyperlipidemia Father    Hypertension Father    Heart disease Father    Cancer Brother        bone cancer as a child   Cancer Paternal Uncle        pancreatic   Heart disease Maternal Grandfather    Osteoporosis Maternal Grandmother    Breast cancer Maternal Aunt 80   Stroke Neg Hx    Diabetes Neg Hx      Past Surgical History:  Procedure Laterality Date   COLONOSCOPY  12/2009   Dr. Benson Norway; sessile polyp, medium hemorrhoids, diverticulum   FEMORAL HERNIA REPAIR Right 10/22/2018   Procedure: RIGHT FEMORAL HERNIA REPAIR WITH MESH;  Surgeon: Donnie Mesa, MD;  Location: [facility];  Service: General;  Laterality: Right;   INGUINAL HERNIA REPAIR  2008   left   INSERTION OF MESH Right 10/22/2018   Procedure: INSERTION OF MESH;  Surgeon: Donnie Mesa, MD;  Location: [facility];  Service: General;  Laterality: Right;   SKIN BIOPSY Left 10/20/2021   compond nevus with moderate atypia    Social History   Socioeconomic History   Marital status: Married    Spouse name: Not on file   Number of children: Not on file   Years of education: Not on file   Highest education level: Not on file  Occupational History  Occupation: Radio broadcast assistant: [employer]  Tobacco Use   Smoking status: Former    Packs/day: 0.25    Years: 10.00    Additional pack years: 0.00    Total pack years: 2.50    Types: Cigarettes    Quit date: 10/27/1991    Years since quitting: 31.2   Smokeless tobacco: Never  Vaping Use   Vaping Use: Never used  Substance and Sexual Activity   Alcohol use: Yes    Alcohol/week: 7.0 standard drinks of alcohol    Types: 7 Glasses of wine per week    Comment: social   Drug use: No   Sexual activity: Yes    Birth control/protection: Post-menopausal    Comment: 1st intercourse 24 yo-5 partners  Other Topics Concern   Not on file  Social History Narrative   Married, no children, exercise - walks the dog,  travels a lot for work, 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  Travels to [place], [place], [place].     Social Determinants of Health   Financial Resource Strain: Not on file  Food Insecurity: No Food Insecurity (11/05/2022)   Hunger Vital Sign    Worried About Running Out of Food in the Last Year: Never true    Ran Out of Food in the Last Year: Never true  Transportation Needs: No Transportation Needs (11/05/2022)   PRAPARE - Hydrologist (Medical): No    Lack of Transportation (Non-Medical): No  Physical Activity: Not on file  Stress: Not on file  Social Connections: Not on file  Intimate Partner Violence: Not At Risk (11/05/2022)   Humiliation, Afraid, Rape, and Kick questionnaire    Fear of Current or Ex-Partner: No    Emotionally Abused: No    Physically Abused: No    Sexually Abused: No     No Known Allergies   Outpatient Medications Prior to Visit  Medication Sig Dispense Refill   albuterol (VENTOLIN HFA) 108 (90 Base) MCG/ACT inhaler Inhale 2 puffs into the lungs every 6 (six) hours as needed for wheezing or shortness of breath. 8 g 0   B Complex-C (B-COMPLEX WITH VITAMIN C) tablet Take 1 tablet by mouth daily.     Budeson-Glycopyrrol-Formoterol (BREZTRI AEROSPHERE) 160-9-4.8 MCG/ACT AERO Inhale 2 puffs into the lungs in the morning and at bedtime. 1 each 11   Calcium Carbonate-Vitamin D 600-200 MG-UNIT TABS Take 2 tablets by mouth daily.     docusate sodium (COLACE) 100 MG capsule Take 100 mg by mouth daily as needed for mild constipation.     famotidine (PEPCID) 20 MG tablet Take 1 tablet (20 mg total) by mouth daily. (Patient taking differently: Take 40 mg by mouth daily.) 30 tablet 2   guaiFENesin (ROBITUSSIN) 100 MG/5ML liquid Take 10 mLs by mouth every 4 (four) hours as needed for cough or to loosen phlegm. 120 mL 0   ipratropium-albuterol (DUONEB) 0.5-2.5 (3) MG/3ML SOLN Take 3 mLs by nebulization  every 6 (six) hours as needed. 360 mL 3   Multiple Vitamin (MULTIVITAMIN ADULT PO) Take 1 tablet by mouth daily.     Omega-3 Fatty Acids (SALMON OIL PO) Take 1 capsule by mouth daily.     pantoprazole (PROTONIX) 40 MG tablet TAKE 1 TABLET BY MOUTH TWICE A DAY (Patient taking differently: Take 40 mg by mouth daily.) 60 tablet 2   fluticasone (FLONASE) 50 MCG/ACT nasal spray Place 1 spray into both nostrils daily. (  Patient not taking: Reported on 01/26/2023) 16 g 11   hydrOXYzine (ATARAX) 25 MG tablet Take 1 tablet (25 mg total) by mouth 3 (three) times daily as needed for anxiety. (Patient not taking: Reported on 11/08/2022) 30 tablet 0   predniSONE (DELTASONE) 10 MG tablet Take 4 tabs by mouth for 3 days, then 3 for 3 days, 2 for 3 days, 1 for 3 days and stop (Patient not taking: Reported on 01/26/2023) 30 tablet 0   No facility-administered medications prior to visit.       Objective:   Physical Exam:  General appearance: 62 y.o., female, NAD, conversant  Eyes: anicteric sclerae; PERRL, tracking appropriately HENT: NCAT; MMM Neck: Trachea midline; no lymphadenopathy, no JVD Lungs: CTAB, no crackles, no wheeze, with normal respiratory effort CV: RRR, no murmur  Abdomen: Soft, non-tender; non-distended, BS present  Extremities: No peripheral edema, warm Skin: Normal turgor and texture; no rash Psych: Appropriate affect Neuro: Alert and oriented to person and place, no focal deficit     Vitals:   01/26/23 1310  BP: 112/68  Pulse: 62  Temp: 98.1 F (36.7 C)  TempSrc: Oral  SpO2: 100%  Weight: 101 lb 6.4 oz (46 kg)  Height: 5\' 8"  (1.727 m)    100% on RA BMI Readings from Last 3 Encounters:  01/26/23 15.42 kg/m  12/01/22 15.35 kg/m  11/06/22 15.22 kg/m   Wt Readings from Last 3 Encounters:  01/26/23 101 lb 6.4 oz (46 kg)  12/01/22 98 lb (44.5 kg)  11/06/22 97 lb 3.2 oz (44.1 kg)     CBC    Component Value Date/Time   WBC 7.6 11/05/2022 0833   RBC 3.78 (L)  11/05/2022 0833   HGB 12.6 11/05/2022 0833   HGB 15.5 11/08/2020 1234   HCT 37.3 11/05/2022 0833   HCT 45.7 11/08/2020 1234   PLT 232 11/05/2022 0833   PLT 272 11/08/2020 1234   MCV 98.7 11/05/2022 0833   MCV 96 11/08/2020 1234   MCH 33.3 11/05/2022 0833   MCHC 33.8 11/05/2022 0833   RDW 12.9 11/05/2022 0833   RDW 12.3 11/08/2020 1234   LYMPHSABS 4.3 (H) 11/04/2022 2216   LYMPHSABS 2.6 11/08/2020 1234   MONOABS 0.5 11/04/2022 2216   EOSABS 1.1 (H) 11/04/2022 2216   EOSABS 0.2 11/08/2020 1234   BASOSABS 0.1 11/04/2022 2216   BASOSABS 0.1 11/08/2020 1234    Eos 1.1 11/04/22  Chest Imaging: CTA Chest 11/05/22 reviewed by me unremarkable  Pulmonary Functions Testing Results:    Latest Ref Rng & Units 01/25/2023    1:46 PM  PFT Results  FVC-Pre L 3.21  P  FVC-Predicted Pre % 86  P  FVC-Post L 3.45  P  FVC-Predicted Post % 92  P  Pre FEV1/FVC % % 39  P  Post FEV1/FCV % % 59  P  FEV1-Pre L 1.26  P  FEV1-Predicted Pre % 43  P  FEV1-Post L 2.05  P  DLCO uncorrected ml/min/mmHg 22.18  P  DLCO UNC% % 97  P  DLCO corrected ml/min/mmHg 22.18  P  DLCO COR %Predicted % 97  P  DLVA Predicted % 104  P  TLC L 5.86  P  TLC % Predicted % 103  P  RV % Predicted % 111  P    P Preliminary result   PFTs with mild obstruction and excellent bronchodilator response  Echocardiogram 2022:    1. Left ventricular ejection fraction, by estimation, is 60 to 65%. The  left ventricle has normal function. The left ventricle has no regional  wall motion abnormalities. Left ventricular diastolic parameters were  normal.   2. Right ventricular systolic function is normal. The right ventricular  size is normal.   3. The mitral valve is normal in structure. Trivial mitral valve  regurgitation. No evidence of mitral stenosis.   4. The aortic valve is tricuspid. Aortic valve regurgitation is not  visualized. Mild aortic valve sclerosis is present, with no evidence of  aortic valve stenosis.    5. The inferior vena cava is normal in size with greater than 50%  respiratory variability, suggesting right atrial pressure of 3 mmHg.       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

## 2023-01-25 ENCOUNTER — Ambulatory Visit (INDEPENDENT_AMBULATORY_CARE_PROVIDER_SITE_OTHER): Payer: 59 | Admitting: Student

## 2023-01-25 DIAGNOSIS — R053 Chronic cough: Secondary | ICD-10-CM

## 2023-01-25 LAB — PULMONARY FUNCTION TEST
DL/VA % pred: 104 %
DL/VA: 4.27 ml/min/mmHg/L
DLCO cor % pred: 97 %
DLCO cor: 22.18 ml/min/mmHg
DLCO unc % pred: 97 %
DLCO unc: 22.18 ml/min/mmHg
FEF 25-75 Post: 1.82 L/sec
FEF 25-75 Pre: 0.72 L/sec
FEF2575-%Change-Post: 153 %
FEF2575-%Pred-Post: 74 %
FEF2575-%Pred-Pre: 29 %
FEV1-%Change-Post: 62 %
FEV1-%Pred-Post: 71 %
FEV1-%Pred-Pre: 43 %
FEV1-Post: 2.05 L
FEV1-Pre: 1.26 L
FEV1FVC-%Change-Post: 51 %
FEV1FVC-%Pred-Pre: 50 %
FEV6-%Change-Post: 11 %
FEV6-%Pred-Post: 95 %
FEV6-%Pred-Pre: 85 %
FEV6-Post: 3.44 L
FEV6-Pre: 3.08 L
FEV6FVC-%Change-Post: 3 %
FEV6FVC-%Pred-Post: 103 %
FEV6FVC-%Pred-Pre: 99 %
FVC-%Change-Post: 7 %
FVC-%Pred-Post: 92 %
FVC-%Pred-Pre: 86 %
FVC-Post: 3.45 L
FVC-Pre: 3.21 L
Post FEV1/FVC ratio: 59 %
Post FEV6/FVC ratio: 100 %
Pre FEV1/FVC ratio: 39 %
Pre FEV6/FVC Ratio: 96 %
RV % pred: 111 %
RV: 2.51 L
TLC % pred: 103 %
TLC: 5.86 L

## 2023-01-25 NOTE — Patient Instructions (Signed)
Full PFT performed today. °

## 2023-01-25 NOTE — Progress Notes (Signed)
Full PFT performed today. °

## 2023-01-26 ENCOUNTER — Ambulatory Visit: Payer: 59 | Admitting: Student

## 2023-01-26 ENCOUNTER — Encounter: Payer: Self-pay | Admitting: Student

## 2023-01-26 VITALS — BP 112/68 | HR 62 | Temp 98.1°F | Ht 68.0 in | Wt 101.4 lb

## 2023-01-26 DIAGNOSIS — J8283 Eosinophilic asthma: Secondary | ICD-10-CM

## 2023-01-26 DIAGNOSIS — R053 Chronic cough: Secondary | ICD-10-CM | POA: Diagnosis not present

## 2023-01-26 LAB — CBC WITH DIFFERENTIAL/PLATELET
Basophils Absolute: 0 10*3/uL (ref 0.0–0.1)
Basophils Relative: 0.5 % (ref 0.0–3.0)
Eosinophils Absolute: 0.9 10*3/uL — ABNORMAL HIGH (ref 0.0–0.7)
Eosinophils Relative: 12.2 % — ABNORMAL HIGH (ref 0.0–5.0)
HCT: 41.1 % (ref 36.0–46.0)
Hemoglobin: 13.9 g/dL (ref 12.0–15.0)
Lymphocytes Relative: 38 % (ref 12.0–46.0)
Lymphs Abs: 2.7 10*3/uL (ref 0.7–4.0)
MCHC: 33.9 g/dL (ref 30.0–36.0)
MCV: 100 fl (ref 78.0–100.0)
Monocytes Absolute: 0.3 10*3/uL (ref 0.1–1.0)
Monocytes Relative: 4.9 % (ref 3.0–12.0)
Neutro Abs: 3.1 10*3/uL (ref 1.4–7.7)
Neutrophils Relative %: 44.4 % (ref 43.0–77.0)
Platelets: 285 10*3/uL (ref 150.0–400.0)
RBC: 4.11 Mil/uL (ref 3.87–5.11)
RDW: 12.9 % (ref 11.5–15.5)
WBC: 7.1 10*3/uL (ref 4.0–10.5)

## 2023-01-26 NOTE — Patient Instructions (Addendum)
-   labs today - breztri 2 puffs twice daily WITH spacer, rinse mouth, brush teeth/tongue after each use, rinse spacer once daily - albuterol 1-2 puffs as needed this is your rescue inhaler - ok to use allerclear as needed for sinonasal congestion, neilmed neti pot with salt packet and distilled water or water that's been boiled and cooled - see you in 3 months or sooner if need be

## 2023-01-30 LAB — RESPIRATORY ALLERGY PROFILE REGION II ~~LOC~~
Allergen, A. alternata, m6: <0.1 kU/L
Allergen, Cedar tree, t12: 0.14 kU/L — ABNORMAL HIGH
Allergen, Comm Silver Birch, t9: <0.1 kU/L
Allergen, Cottonwood, t14: <0.1 kU/L
Allergen, D pternoyssinus,d7: <0.1 kU/L
Allergen, Mouse Urine Protein, e78: <0.1 kU/L
Allergen, Mulberry, t76: <0.1 kU/L
Allergen, Oak,t7: 0.19 kU/L — ABNORMAL HIGH
Allergen, P. notatum, m1: <0.1 kU/L
Aspergillus fumigatus, m3: <0.1 kU/L
Bermuda Grass: <0.1 kU/L
Box Elder IgE: <0.1 kU/L
CLADOSPORIUM HERBARUM (M2) IGE: <0.1 kU/L
COMMON RAGWEED (SHORT) (W1) IGE: <0.1 kU/L
Cat Dander: <0.1 kU/L
Class: 0
Class: 0
Class: 0
Class: 0
Class: 0
Class: 0
Class: 0
Class: 0
Class: 0
Class: 0
Class: 0
Class: 0
Class: 0
Class: 0
Class: 0
Class: 0
Class: 0
Class: 0
Class: 0
Class: 0
Class: 0
Class: 0
Cockroach: <0.1 kU/L
D. farinae: <0.1 kU/L
Dog Dander: <0.1 kU/L
Elm IgE: <0.1 kU/L
IgE (Immunoglobulin E), Serum: 46 kU/L (ref <=114)
Johnson Grass: <0.1 kU/L
Pecan/Hickory Tree IgE: <0.1 kU/L
Rough Pigweed  IgE: <0.1 kU/L
Sheep Sorrel IgE: <0.1 kU/L
Timothy Grass: <0.1 kU/L

## 2023-01-30 LAB — INTERPRETATION:

## 2023-02-01 LAB — ASPERGILLUS IGE PANEL
A. Amstel/Glaucu Class Interp: 0
A. Flavus Class Interp: 0
A. Fumigatus Class Interp: 0
A. Nidulans Class Interp: 0
A. Niger Class Interp: 0
A. Versicolor Class Interp: 0
Aspergillus amstel/glaucu IgE*: <0.35 kU/L (ref <0.35)
Aspergillus flavus IgE: <0.35 kU/L (ref <0.35)
Aspergillus fumigatus IgE: <0.1 kU/L (ref <0.35)
Aspergillus nidulans IgE: <0.35 kU/L (ref <0.35)
Aspergillus niger IgE: <0.1 kU/L (ref <0.35)
Aspergillus versicolor IgE: <0.1 kU/L (ref <0.35)

## 2023-02-05 ENCOUNTER — Ambulatory Visit (INDEPENDENT_AMBULATORY_CARE_PROVIDER_SITE_OTHER): Payer: 59

## 2023-02-05 ENCOUNTER — Ambulatory Visit
Admission: EM | Admit: 2023-02-05 | Discharge: 2023-02-05 | Disposition: A | Discharge status: Home / Self Care | Payer: 59 | Attending: Urgent Care | Admitting: Urgent Care

## 2023-02-05 DIAGNOSIS — R109 Unspecified abdominal pain: Secondary | ICD-10-CM | POA: Diagnosis not present

## 2023-02-05 DIAGNOSIS — K529 Noninfective gastroenteritis and colitis, unspecified: Secondary | ICD-10-CM | POA: Diagnosis not present

## 2023-02-05 MED ORDER — ONDANSETRON 4 MG PO TBDP: 4.0000 mg | ORAL_TABLET | Freq: Three times a day (TID) | ORAL | 0 refills | Status: DC | PRN

## 2023-02-05 MED ORDER — SUCRALFATE 1 G PO TABS: 1.0000 g | ORAL_TABLET | Freq: Three times a day (TID) | ORAL | 0 refills | Status: DC

## 2023-02-05 NOTE — Discharge Instructions (Signed)
Your vital signs are stable and your abdominal x-ray does not show any acute concerning findings. I suspect some of your symptoms are related to decreasing your PPI. Your diarrhea and vomiting are likely due to a viral GI bug. Please start taking the Carafate up to 4 times daily in addition to your normal PPI. Try to eat bland foods to prevent any further vomiting. I have called in zofran for you to take if nausea or vomiting persists.  This will dissolve under your tongue every 8 hours as needed. Please call your gastroenterologist should symptoms persist, or head to the emergency room if your abdominal discomfort worsens.

## 2023-02-05 NOTE — ED Provider Notes (Signed)
[facility]    CSN: [id number] Arrival date & time: 02/05/23  1702      History   Chief Complaint Chief Complaint  Patient presents with   Abdominal Pain    Entered by patient    HPI Kathryn Myers is a 62 y.o. female.   Pleasant 62 year old female presents today due to concerns of epigastric discomfort, diarrhea, and vomiting.  She states for the past 5 to 6 days, she has had a burning epigastric discomfort.  She follows with GI for GERD, candidal esophagitis and hiatal hernia, and was recently instructed to decrease her PPI and H2 blocker.  She used to take this twice daily, and is now only doing it once daily.  Her symptoms started initially at nighttime which woke her from sleep with a burning in her epigastric region.  She states those symptoms are still present, but now is also having a crampy lower abdominal discomfort.  States her normal bowel habits are 3 times weekly, did have a bout of diarrhea on Saturday, no BM since.  She states she has had a decreased appetite over the past 24 hours, has only eaten a few saltine crackers today.  She did vomit 1 time yesterday.  Denies abdominal distention.  No fever.  Mom was sick, and was concerned maybe she was developing gastroenteritis.   Abdominal Pain   Past Medical History:  Diagnosis Date   Allergic rhinitis    Femoral hernia of right side    Hemorrhoids    Hx of abnormal cervical Pap smear    Osteopenia 11/2012   repeat in 3-5 years   Venereal disease contact    uses condoms, husband is HIV+   Wears glasses     Patient Active Problem List   Diagnosis Date Noted   Hiatal hernia 12/12/2022   Acute asthma exacerbation 11/05/2022   Hypokalemia 11/05/2022   Acute respiratory failure with hypoxia 11/05/2022   Mild intermittent asthma with exacerbation 09/06/2022   Gastroesophageal reflux disease 09/06/2022   Enlarged lymph nodes 01/02/2020   Lymphadenopathy, inguinal 01/02/2020   Routine general medical  examination at a health care facility 09/24/2017   Weight loss 09/24/2017   Screening for cervical cancer 09/24/2017   Vaccine counseling 09/24/2017   Non-recurrent unilateral inguinal hernia without obstruction or gangrene 09/24/2017   Cervical polyp 09/24/2017   Screen for STD (sexually transmitted disease) 09/24/2017   Encounter for health maintenance examination in adult 10/25/2015   Rhinitis, allergic 10/25/2015   Venereal disease contact 10/25/2015   Need for prophylactic vaccination and inoculation against influenza 10/25/2015   Edema 10/25/2015   BMI less than 19,adult 10/25/2015   LLQ abdominal tenderness 10/25/2015   Osteopenia 11/02/2014    Past Surgical History:  Procedure Laterality Date   COLONOSCOPY  12/2009   Dr. Benson Norway; sessile polyp, medium hemorrhoids, diverticulum   FEMORAL HERNIA REPAIR Right 10/22/2018   Procedure: RIGHT FEMORAL HERNIA REPAIR WITH MESH;  Surgeon: Donnie Mesa, MD;  Location: [place];  Service: General;  Laterality: Right;   INGUINAL HERNIA REPAIR  2008   left   INSERTION OF MESH Right 10/22/2018   Procedure: INSERTION OF MESH;  Surgeon: Donnie Mesa, MD;  Location: [place];  Service: General;  Laterality: Right;   SKIN BIOPSY Left 10/20/2021   compond nevus with moderate atypia    OB History     Gravida  0   Para  0   Term  0   Preterm  0   AB  0   Living  0      SAB  0   IAB  0   Ectopic  0   Multiple  0   Live Births  0            Home Medications    Prior to Admission medications   Medication Sig Start Date End Date Taking? Authorizing Provider  ondansetron (ZOFRAN-ODT) 4 MG disintegrating tablet Take 1 tablet (4 mg total) by mouth every 8 (eight) hours as needed for nausea or vomiting. 02/05/23  Yes Raymir Frommelt L, PA  sucralfate (CARAFATE) 1 g tablet Take 1 tablet (1 g total) by mouth 4 (four) times daily -  with meals and at bedtime for 7 days. 02/05/23 02/12/23 Yes  Finnley Larusso L, PA  albuterol (VENTOLIN HFA) 108 (90 Base) MCG/ACT inhaler Inhale 2 puffs into the lungs every 6 (six) hours as needed for wheezing or shortness of breath. 09/06/22   Denita Lung, MD  B Complex-C (B-COMPLEX WITH VITAMIN C) tablet Take 1 tablet by mouth daily.    [provider]  Budeson-Glycopyrrol-Formoterol (BREZTRI AEROSPHERE) 160-9-4.8 MCG/ACT AERO Inhale 2 puffs into the lungs in the morning and at bedtime. 12/01/22   Maryjane Hurter, MD  Calcium Carbonate-Vitamin D 600-200 MG-UNIT TABS Take 2 tablets by mouth daily.    [provider]  docusate sodium (COLACE) 100 MG capsule Take 100 mg by mouth daily as needed for mild constipation.    [provider]  famotidine (PEPCID) 20 MG tablet Take 1 tablet (20 mg total) by mouth daily. Patient taking differently: Take 40 mg by mouth daily. 11/07/22   Hosie Poisson, MD  fluticasone (FLONASE) 50 MCG/ACT nasal spray Place 1 spray into both nostrils daily. Patient not taking: Reported on 01/26/2023 12/01/22   Maryjane Hurter, MD  guaiFENesin (ROBITUSSIN) 100 MG/5ML liquid Take 10 mLs by mouth every 4 (four) hours as needed for cough or to loosen phlegm. 11/07/22   Hosie Poisson, MD  hydrOXYzine (ATARAX) 25 MG tablet Take 1 tablet (25 mg total) by mouth 3 (three) times daily as needed for anxiety. Patient not taking: Reported on 11/08/2022 11/07/22   Hosie Poisson, MD  ipratropium-albuterol (DUONEB) 0.5-2.5 (3) MG/3ML SOLN Take 3 mLs by nebulization every 6 (six) hours as needed. 11/07/22   Hosie Poisson, MD  Multiple Vitamin (MULTIVITAMIN ADULT PO) Take 1 tablet by mouth daily.    [provider]  Omega-3 Fatty Acids (SALMON OIL PO) Take 1 capsule by mouth daily.    [provider]  pantoprazole (PROTONIX) 40 MG tablet TAKE 1 TABLET BY MOUTH TWICE A DAY Patient taking differently: Take 40 mg by mouth daily. 12/27/22   Denita Lung, MD  predniSONE (DELTASONE) 10 MG tablet Take 4 tabs by mouth  for 3 days, then 3 for 3 days, 2 for 3 days, 1 for 3 days and stop Patient not taking: Reported on 01/26/2023 12/01/22   Maryjane Hurter, MD    Family History Family History  Problem Relation Age of Onset   Hyperlipidemia Mother    Osteoporosis Mother    Parkinsonism Father    Hyperlipidemia Father    Hypertension Father    Heart disease Father    Cancer Brother        bone cancer as a child   Cancer Paternal Uncle        pancreatic   Heart disease Maternal Grandfather    Osteoporosis Maternal  Grandmother    Breast cancer Maternal Aunt 80   Stroke Neg Hx    Diabetes Neg Hx     Social History Social History   Tobacco Use   Smoking status: Former    Packs/day: 0.25    Years: 10.00    Additional pack years: 0.00    Total pack years: 2.50    Types: Cigarettes    Quit date: 10/27/1991    Years since quitting: 31.2   Smokeless tobacco: Never  Vaping Use   Vaping Use: Never used  Substance Use Topics   Alcohol use: Yes    Alcohol/week: 7.0 standard drinks of alcohol    Types: 7 Glasses of wine per week    Comment: social   Drug use: No     Allergies   Patient has no known allergies.   Review of Systems Review of Systems  Gastrointestinal:  Positive for abdominal pain.  As per HPI   Physical Exam Triage Vital Signs ED Triage Vitals  Enc Vitals Group     BP 02/05/23 1734 138/76     Pulse Rate 02/05/23 1734 63     Resp 02/05/23 1734 16     Temp 02/05/23 1734 98.3 F (36.8 C)     Temp Source 02/05/23 1733 Oral     SpO2 02/05/23 1734 96 %     Weight --      Height --      Head Circumference --      Peak Flow --      Pain Score 02/05/23 1733 0     Pain Loc --      Pain Edu? --      Excl. in [place]? --    No data found.  Updated Vital Signs BP 138/76 (BP Location: Left Arm)   Pulse 63   Temp 98.3 F (36.8 C) (Oral)   Resp 16   LMP 07/28/2011   SpO2 96%   Visual Acuity Right Eye Distance:   Left Eye Distance:   Bilateral Distance:    Right  Eye Near:   Left Eye Near:    Bilateral Near:     Physical Exam Vitals and nursing note reviewed. Exam conducted with a chaperone present.  Constitutional:      General: She is not in acute distress.    Appearance: She is well-developed. She is not ill-appearing, toxic-appearing or diaphoretic.     Comments: Thin, cachectic  HENT:     Head: Normocephalic and atraumatic.     Mouth/Throat:     Mouth: Mucous membranes are moist.     Pharynx: Oropharynx is clear. No pharyngeal swelling or oropharyngeal exudate.  Eyes:     General: No scleral icterus.    Extraocular Movements: Extraocular movements intact.     Pupils: Pupils are equal, round, and reactive to light.  Cardiovascular:     Rate and Rhythm: Normal rate and regular rhythm.     Heart sounds: No murmur heard. Pulmonary:     Effort: Pulmonary effort is normal. No respiratory distress.     Breath sounds: Normal breath sounds. No wheezing, rhonchi or rales.  Chest:     Chest wall: No tenderness.  Abdominal:     General: Abdomen is flat. Bowel sounds are decreased. There is no distension.     Palpations: Abdomen is soft. There is no hepatomegaly or splenomegaly.     Tenderness: There is no abdominal tenderness. There is no right CVA tenderness, left CVA tenderness, guarding or  rebound. Negative signs include Murphy's sign, Rovsing's sign and McBurney's sign.     Hernia: No hernia is present.     Comments: Tympany noted to R/LLQ, dullness noted to R/LUQ  Skin:    General: Skin is warm and dry.     Coloration: Skin is not jaundiced or pale.     Findings: No erythema or rash.  Neurological:     General: No focal deficit present.     Mental Status: She is alert and oriented to person, place, and time.      UC Treatments / Results  Labs (all labs ordered are listed, but only abnormal results are displayed) Labs Reviewed - No data to display  EKG   Radiology DG Abd 2 Views  Result Date: 02/05/2023 CLINICAL DATA:   Decreased bowel sounds. EXAM: ABDOMEN - 2 VIEW COMPARISON:  CT scan 10/19/2017 FINDINGS: Gas is seen in nondilated loops of small and large bowel. No obstruction. Mild scattered colonic stool. Low rounded density in the right hemipelvis is seen which is indeterminate although possibly vascular. IMPRESSION: Nonspecific bowel gas pattern with mild scattered colonic stool. Electronically Signed   By: Jill Side M.D.   On: 02/05/2023 18:18    Procedures Procedures (including critical care time)  Medications Ordered in UC Medications - No data to display  Initial Impression / Assessment and Plan / UC Course  I have reviewed the triage vital signs and the nursing notes.  Pertinent labs & imaging results that were available during my care of the patient were reviewed by me and considered in my medical decision making (see chart for details).     Abdominal cramping -vital signs are stable, no red flag signs or symptoms.  Suspect she has gastroenteritis on top of her chronic GI issues.  Will give patient Carafate to take up to 4 times daily as needed.  Encouraged bland food intake, Zofran as needed.  Patient to follow-up with gastroenteritis should her symptoms persist, emergency room if any new or worsening symptoms.   Final Clinical Impressions(s) / UC Diagnoses   Final diagnoses:  Abdominal cramping  Gastroenteritis     Discharge Instructions      Your vital signs are stable and your abdominal x-ray does not show any acute concerning findings. I suspect some of your symptoms are related to decreasing your PPI. Your diarrhea and vomiting are likely due to a viral GI bug. Please start taking the Carafate up to 4 times daily in addition to your normal PPI. Try to eat bland foods to prevent any further vomiting. I have called in zofran for you to take if nausea or vomiting persists.  This will dissolve under your tongue every 8 hours as needed. Please call your gastroenterologist should  symptoms persist, or head to the emergency room if your abdominal discomfort worsens.    ED Prescriptions     Medication Sig Dispense Auth. Provider   sucralfate (CARAFATE) 1 g tablet Take 1 tablet (1 g total) by mouth 4 (four) times daily -  with meals and at bedtime for 7 days. 28 tablet Cordie Beazley L, PA   ondansetron (ZOFRAN-ODT) 4 MG disintegrating tablet Take 1 tablet (4 mg total) by mouth every 8 (eight) hours as needed for nausea or vomiting. 20 tablet Ruther Ephraim L, [place]      PDMP not reviewed this encounter.   Chaney Malling, [place] 02/05/23 2121

## 2023-02-05 NOTE — ED Triage Notes (Signed)
Pt c/o abd pain that began Wednesday night.   Home interventions: prescribed medications

## 2023-04-04 ENCOUNTER — Ambulatory Visit (HOSPITAL_COMMUNITY): Admission: RE | Admit: 2023-04-04 | Payer: 59 | Source: Home / Self Care | Admitting: Gastroenterology

## 2023-04-04 ENCOUNTER — Encounter (HOSPITAL_COMMUNITY): Admission: RE | Payer: Self-pay | Source: Home / Self Care

## 2023-04-04 SURGERY — MANOMETRY, ESOPHAGUS

## 2023-05-02 ENCOUNTER — Other Ambulatory Visit: Payer: Self-pay | Admitting: Family Medicine

## 2023-05-02 DIAGNOSIS — K219 Gastro-esophageal reflux disease without esophagitis: Secondary | ICD-10-CM

## 2023-05-02 NOTE — Telephone Encounter (Signed)
Is it ok to refill? 

## 2023-09-08 ENCOUNTER — Encounter (HOSPITAL_BASED_OUTPATIENT_CLINIC_OR_DEPARTMENT_OTHER): Payer: Self-pay | Admitting: Emergency Medicine

## 2023-09-08 ENCOUNTER — Emergency Department (HOSPITAL_BASED_OUTPATIENT_CLINIC_OR_DEPARTMENT_OTHER): Payer: 59

## 2023-09-08 ENCOUNTER — Emergency Department (HOSPITAL_BASED_OUTPATIENT_CLINIC_OR_DEPARTMENT_OTHER)
Admission: EM | Admit: 2023-09-08 | Discharge: 2023-09-08 | Disposition: A | Discharge status: Home / Self Care | Payer: 59 | Attending: Emergency Medicine | Admitting: Emergency Medicine

## 2023-09-08 DIAGNOSIS — K529 Noninfective gastroenteritis and colitis, unspecified: Secondary | ICD-10-CM | POA: Diagnosis not present

## 2023-09-08 DIAGNOSIS — R112 Nausea with vomiting, unspecified: Secondary | ICD-10-CM | POA: Diagnosis present

## 2023-09-08 DIAGNOSIS — E860 Dehydration: Secondary | ICD-10-CM | POA: Insufficient documentation

## 2023-09-08 LAB — URINALYSIS, ROUTINE W REFLEX MICROSCOPIC
Bacteria, UA: NONE SEEN
Bilirubin Urine: NEGATIVE
Glucose, UA: NEGATIVE mg/dL
Ketones, ur: >80 mg/dL — AB
Leukocytes,Ua: NEGATIVE
Nitrite: NEGATIVE
Protein, ur: 30 mg/dL — AB
Specific Gravity, Urine: 1.033 — ABNORMAL HIGH (ref 1.005–1.030)
pH: 5.5 (ref 5.0–8.0)

## 2023-09-08 LAB — COMPREHENSIVE METABOLIC PANEL
ALT: 12 U/L (ref 0–44)
AST: 19 U/L (ref 15–41)
Albumin: 3.9 g/dL (ref 3.5–5.0)
Alkaline Phosphatase: 40 U/L (ref 38–126)
Anion gap: 9 (ref 5–15)
BUN: 19 mg/dL (ref 8–23)
CO2: 26 mmol/L (ref 22–32)
Calcium: 9.5 mg/dL (ref 8.9–10.3)
Chloride: 102 mmol/L (ref 98–111)
Creatinine, Ser: 0.86 mg/dL (ref 0.44–1.00)
GFR, Estimated: >60 mL/min (ref >60)
Glucose, Bld: 169 mg/dL — ABNORMAL HIGH (ref 70–99)
Potassium: 3.8 mmol/L (ref 3.5–5.1)
Sodium: 137 mmol/L (ref 135–145)
Total Bilirubin: 0.9 mg/dL (ref 0.3–1.2)
Total Protein: 6.7 g/dL (ref 6.5–8.1)

## 2023-09-08 LAB — CBC
HCT: 42.9 % (ref 36.0–46.0)
Hemoglobin: 14.9 g/dL (ref 12.0–15.0)
MCH: 33.6 pg (ref 26.0–34.0)
MCHC: 34.7 g/dL (ref 30.0–36.0)
MCV: 96.8 fL (ref 80.0–100.0)
Platelets: 293 10*3/uL (ref 150–400)
RBC: 4.43 MIL/uL (ref 3.87–5.11)
RDW: 12.6 % (ref 11.5–15.5)
WBC: 12.4 10*3/uL — ABNORMAL HIGH (ref 4.0–10.5)
nRBC: 0 % (ref 0.0–0.2)

## 2023-09-08 LAB — LIPASE, BLOOD: Lipase: 77 U/L — ABNORMAL HIGH (ref 11–51)

## 2023-09-08 MED ORDER — METRONIDAZOLE 500 MG PO TABS: 500.0000 mg | ORAL_TABLET | Freq: Two times a day (BID) | ORAL | 0 refills | Status: DC

## 2023-09-08 MED ORDER — IOHEXOL 300 MG/ML  SOLN
100.0000 mL | Freq: Once | INTRAMUSCULAR | Status: AC | PRN
  Administered 2023-09-08: 100 mL via INTRAVENOUS

## 2023-09-08 MED ORDER — ONDANSETRON HCL 4 MG/2ML IJ SOLN
4.0000 mg | Freq: Once | INTRAMUSCULAR | Status: AC
  Administered 2023-09-08: 4 mg via INTRAVENOUS
  Filled 2023-09-08: qty 2

## 2023-09-08 MED ORDER — CIPROFLOXACIN HCL 500 MG PO TABS: 500.0000 mg | ORAL_TABLET | Freq: Two times a day (BID) | ORAL | 0 refills | Status: DC

## 2023-09-08 MED ORDER — SODIUM CHLORIDE 0.9 % IV BOLUS
500.0000 mL | Freq: Once | INTRAVENOUS | Status: AC
  Administered 2023-09-08: 500 mL via INTRAVENOUS

## 2023-09-08 NOTE — ED Triage Notes (Signed)
Vomiting since last Friday, unable to keep anything down since.  Hx GERD got worse since last year but has been getting better with meds

## 2023-09-08 NOTE — Discharge Instructions (Addendum)
Follow-up with your primary care doctor for the other findings involving the left kidney on your CT scan. Try and keep yourself hydrated.

## 2023-09-08 NOTE — ED Notes (Signed)
Ginger ale given to patient. 

## 2023-09-08 NOTE — ED Notes (Signed)
Pt aware of need for stool sample if she is able to provide one. Pt states she cannot at this time. Has no urge to have BM. Pt is drinking gingerale and is tolerating .

## 2023-09-08 NOTE — ED Provider Notes (Signed)
[place] EMERGENCY DEPARTMENT AT [facility] Provider Note   CSN: [id number] Arrival date & time: 09/08/23  1600     History  Chief Complaint  Patient presents with   Emesis    Kathryn Myers is a 62 y.o. female.   Emesis Patient presents with nausea and vomiting.  Began over a week ago.  States she has been vomiting at night.  States she wakes up.  States today however she had vomiting during the day.  Also has had diarrhea.  Has had previous GERD and esophagitis.  Sees Dr. Elnoria Howard.  No fevers.  States she feels weak.  Pain is in the epigastric to left abdomen.     Home Medications Prior to Admission medications   Medication Sig Start Date End Date Taking? Authorizing Provider  ciprofloxacin (CIPRO) 500 MG tablet Take 1 tablet (500 mg total) by mouth 2 (two) times daily. 09/08/23  Yes Benjiman Core, MD  metroNIDAZOLE (FLAGYL) 500 MG tablet Take 1 tablet (500 mg total) by mouth 2 (two) times daily. 09/08/23  Yes Benjiman Core, MD  albuterol (VENTOLIN HFA) 108 (90 Base) MCG/ACT inhaler Inhale 2 puffs into the lungs every 6 (six) hours as needed for wheezing or shortness of breath. 09/06/22   Ronnald Nian, MD  B Complex-C (B-COMPLEX WITH VITAMIN C) tablet Take 1 tablet by mouth daily.    [provider]  Budeson-Glycopyrrol-Formoterol (BREZTRI AEROSPHERE) 160-9-4.8 MCG/ACT AERO Inhale 2 puffs into the lungs in the morning and at bedtime. 12/01/22   Omar Person, MD  Calcium Carbonate-Vitamin D 600-200 MG-UNIT TABS Take 2 tablets by mouth daily.    [provider]  docusate sodium (COLACE) 100 MG capsule Take 100 mg by mouth daily as needed for mild constipation.    [provider]  famotidine (PEPCID) 20 MG tablet Take 1 tablet (20 mg total) by mouth daily. Patient taking differently: Take 40 mg by mouth daily. 11/07/22   Kathlen Mody, MD  fluticasone (FLONASE) 50 MCG/ACT nasal spray Place 1 spray into both nostrils daily. Patient not  taking: Reported on 01/26/2023 12/01/22   Omar Person, MD  guaiFENesin (ROBITUSSIN) 100 MG/5ML liquid Take 10 mLs by mouth every 4 (four) hours as needed for cough or to loosen phlegm. 11/07/22   Kathlen Mody, MD  hydrOXYzine (ATARAX) 25 MG tablet Take 1 tablet (25 mg total) by mouth 3 (three) times daily as needed for anxiety. Patient not taking: Reported on 11/08/2022 11/07/22   Kathlen Mody, MD  ipratropium-albuterol (DUONEB) 0.5-2.5 (3) MG/3ML SOLN Take 3 mLs by nebulization every 6 (six) hours as needed. 11/07/22   Kathlen Mody, MD  Multiple Vitamin (MULTIVITAMIN ADULT PO) Take 1 tablet by mouth daily.    [provider]  Omega-3 Fatty Acids (SALMON OIL PO) Take 1 capsule by mouth daily.    [provider]  ondansetron (ZOFRAN-ODT) 4 MG disintegrating tablet Take 1 tablet (4 mg total) by mouth every 8 (eight) hours as needed for nausea or vomiting. 02/05/23   Crain, Whitney L, PA  pantoprazole (PROTONIX) 40 MG tablet Take 1 tablet (40 mg total) by mouth daily. 05/02/23   Ronnald Nian, MD  predniSONE (DELTASONE) 10 MG tablet Take 4 tabs by mouth for 3 days, then 3 for 3 days, 2 for 3 days, 1 for 3 days and stop Patient not taking: Reported on 01/26/2023 12/01/22   Omar Person, MD  sucralfate (CARAFATE) 1 g tablet Take 1 tablet (1 g total) by  mouth 4 (four) times daily -  with meals and at bedtime for 7 days. 02/05/23 02/12/23  Guy Sandifer L, PA      Allergies    Patient has no known allergies.    Review of Systems   Review of Systems  Gastrointestinal:  Positive for vomiting.    Physical Exam Updated Vital Signs BP 109/64   Pulse 72   Temp 98.3 F (36.8 C)   Resp 16   LMP 07/28/2011   SpO2 94%  Physical Exam Vitals reviewed.  Cardiovascular:     Rate and Rhythm: Regular rhythm. Tachycardia present.  Abdominal:     Tenderness: There is abdominal tenderness.     Comments: Epigastric to left upper quadrant tenderness.  No hernia palpated.  Musculoskeletal:         General: Tenderness present.     ED Results / Procedures / Treatments   Labs (all labs ordered are listed, but only abnormal results are displayed) Labs Reviewed  LIPASE, BLOOD - Abnormal; Notable for the following components:      Result Value   Lipase 77 (*)    All other components within normal limits  COMPREHENSIVE METABOLIC PANEL - Abnormal; Notable for the following components:   Glucose, Bld 169 (*)    All other components within normal limits  CBC - Abnormal; Notable for the following components:   WBC 12.4 (*)    All other components within normal limits  URINALYSIS, ROUTINE W REFLEX MICROSCOPIC - Abnormal; Notable for the following components:   Specific Gravity, Urine 1.033 (*)    Hgb urine dipstick MODERATE (*)    Ketones, ur >80 (*)    Protein, ur 30 (*)    All other components within normal limits  GASTROINTESTINAL PANEL BY PCR, STOOL (REPLACES STOOL CULTURE)  C DIFFICILE QUICK SCREEN W PCR REFLEX      EKG None  Radiology CT ABDOMEN PELVIS W CONTRAST  Result Date: 09/08/2023 CLINICAL DATA:  Bowel obstruction, vomiting EXAM: CT ABDOMEN AND PELVIS WITH CONTRAST TECHNIQUE: Multidetector CT imaging of the abdomen and pelvis was performed using the standard protocol following bolus administration of intravenous contrast. RADIATION DOSE REDUCTION: This exam was performed according to the departmental dose-optimization program which includes automated exposure control, adjustment of the mA and/or kV according to patient size and/or use of iterative reconstruction technique. CONTRAST:  OMNIPAQUE IOHEXOL 300 MG/ML  SOLN COMPARISON:  10/20/2017 FINDINGS: Lower chest: No acute abnormality. Hepatobiliary: No focal liver abnormality is seen. No gallstones, gallbladder wall thickening, or biliary dilatation. Pancreas: Unremarkable Spleen: Unremarkable Adrenals/Urinary Tract: The adrenal glands are unremarkable. The kidneys are normal in size and position. The left  renal vein is markedly narrowed as it passes between the aorta and superior mesenteric artery (image # 19/2) and there is marked dilation of the left ovarian vein and development of left adnexal varices likely representing collateral venous drainage of the left kidney. The kidneys are otherwise unremarkable. Bladder unremarkable. Stomach/Bowel: There is diffuse bowel wall thickening involving the colon, which is decompressed, in keeping with an infectious or inflammatory pancolitis. There is no evidence of obstruction or perforation. No free intraperitoneal gas or fluid. The stomach, small bowel, and large bowel are otherwise unremarkable. The appendix is not visualized and is likely absent. Vascular/Lymphatic: No additional significant vascular findings are identified. No pathologic adenopathy within the abdomen and pelvis. Reproductive: Uterus and bilateral adnexa are unremarkable. Other: No abdominal wall hernia or abnormality. No abdominopelvic ascites. Musculoskeletal: No acute or significant  osseous findings. IMPRESSION: 1. Diffuse large bowel wall thickening in keeping with an infectious or inflammatory pancolitis. No evidence of obstruction or perforation. 2. 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 This is a common anatomic variant but clinical correlation for chronic abdominal pain or intermittent hematuria may be helpful as this may be associated with chronic nutcracker syndrome. Electronically Signed   By: Helyn Numbers M.D.   On: 09/08/2023 18:19    Procedures Procedures    Medications Ordered in ED Medications  ondansetron (ZOFRAN) injection 4 mg (4 mg Intravenous Given 09/08/23 1721)  sodium chloride 0.9 % bolus 500 mL (500 mLs Intravenous New Bag/Given 09/08/23 1720)  iohexol  (OMNIPAQUE) 300 MG/ML solution 100 mL (100 mLs Intravenous Contrast Given 09/08/23 1754)    ED Course/ Medical Decision Making/ A&P                                 Medical Decision Making Amount and/or Complexity of Data Reviewed Labs: ordered. Radiology: ordered.  Risk Prescription drug management.   Patient with epigastric pain.  Nausea and vomiting.  Some diarrhea.  History of esophagitis.  Decreased oral intake.  Feeling worse today.  Differential diagnose includes gastroenteritis, obstruction, pancreatitis.  White count elevated.  Has some dehydration on her urinalysis.  Lipase mildly elevated and with this being more complex will get CT scan to further evaluate.  CT scan shows potential pancolitis.  Does show some vascular abnormalities that potentially could be a nutcracker syndrome.  I think unrelated to today's symptoms.  Discussed with Dr. Elnoria Howard from gastroenterology.  Recommended stool studies.  However patient was not able to provide these.  Will give antibiotics.  Short-term follow-up with him.        Final Clinical Impression(s) / ED Diagnoses Final diagnoses:  Dehydration  Colitis    Rx / DC Orders ED Discharge Orders          Ordered    ciprofloxacin (CIPRO) 500 MG tablet  2 times daily        09/08/23 1905    metroNIDAZOLE (FLAGYL) 500 MG tablet  2 times daily        09/08/23 Thayer Headings, MD 09/08/23 1906

## 2023-09-09 ENCOUNTER — Telehealth (HOSPITAL_BASED_OUTPATIENT_CLINIC_OR_DEPARTMENT_OTHER): Payer: Self-pay | Admitting: Emergency Medicine

## 2023-09-09 MED ORDER — ONDANSETRON 4 MG PO TBDP: 4.0000 mg | ORAL_TABLET | Freq: Three times a day (TID) | ORAL | 0 refills | Status: DC | PRN

## 2023-09-09 NOTE — Telephone Encounter (Signed)
Pt called, seen yesterday and discharged with a diagnosis of pancolitis. On Cipro and Flagyl. Requesting nausea medications. EKG yesterday with a normal Qtc. Zofran ODT prescribed.

## 2023-09-11 LAB — HM MAMMOGRAPHY

## 2023-09-13 ENCOUNTER — Encounter: Payer: Self-pay | Admitting: Family Medicine

## 2023-09-30 ENCOUNTER — Other Ambulatory Visit: Payer: Self-pay | Admitting: Family Medicine

## 2023-09-30 DIAGNOSIS — K219 Gastro-esophageal reflux disease without esophagitis: Secondary | ICD-10-CM

## 2023-10-01 NOTE — Telephone Encounter (Signed)
Pt is due for an appt. Adam sent pt a mychart message to call to schedule

## 2023-10-16 ENCOUNTER — Ambulatory Visit: Payer: 59 | Admitting: Family Medicine

## 2023-10-16 ENCOUNTER — Encounter: Payer: Self-pay | Admitting: Family Medicine

## 2023-10-16 VITALS — BP 132/80 | HR 60 | Ht 68.0 in | Wt 97.8 lb

## 2023-10-16 DIAGNOSIS — B029 Zoster without complications: Secondary | ICD-10-CM | POA: Diagnosis not present

## 2023-10-16 DIAGNOSIS — K219 Gastro-esophageal reflux disease without esophagitis: Secondary | ICD-10-CM | POA: Diagnosis not present

## 2023-10-16 MED ORDER — PANTOPRAZOLE SODIUM 40 MG PO TBEC: 40.0000 mg | DELAYED_RELEASE_TABLET | Freq: Every day | ORAL | 3 refills | Status: DC

## 2023-10-16 MED ORDER — VALACYCLOVIR HCL 1 G PO TABS: 1000.0000 mg | ORAL_TABLET | Freq: Three times a day (TID) | ORAL | 0 refills | Status: AC

## 2023-10-16 NOTE — Progress Notes (Signed)
   Subjective:    Patient ID: Kathryn Myers, female    DOB: 1961/03/06, 62 y.o.   MRN: [id number]  HPI She is here for a rash that developed on Saturday.  He was on her mid back area and now she has also noted 1 on her right chest inferior to the nipple. She would also like her reflux medication renewed.  She is taking it daily for control.  Review of Systems     Objective:    Physical Exam Erythematous vesicular rash present on the right T5 nerve root pattern with another lesion inferior to the right nipple. The rash is T5 distribution on the right.      Assessment & Plan:  Herpes zoster without complication  Gastroesophageal reflux disease, unspecified whether esophagitis present

## 2023-10-16 NOTE — Patient Instructions (Signed)
Shingles  Shingles, or herpes zoster, is an infection. It gives you a skin rash and blisters. These infected areas may hurt a lot. Shingles only happens if: You've had chickenpox. You've been given a shot called a vaccine to protect you from getting chickenpox. Shingles is rare in this case. What are the causes? Shingles is caused by a germ called the varicella-zoster virus. This is the same germ that causes chickenpox. After you're exposed to the germ, it stays in your body but is dormant. This means it isn't active. Shingles happens if the germ becomes active again. This can happen years after you're first exposed to the germ. What increases the risk? You may be more likely to get shingles if: You're older than 64 years of age. You're under a lot of stress. You have a weak immune system. The immune system is your body's defense system. It may be weak if: You have human immunodeficiency virus (HIV). You have acquired immunodeficiency syndrome (AIDS). You have cancer. You take medicines that weaken your immune system. These include organ transplant medicines. What are the signs or symptoms? The first symptoms of shingles may be itching, tingling, or pain. Your skin may feel like it's burning. A few days or weeks later, you'll get a rash. Here's what you can expect: The rash is likely to be on one side of your body. The rash may be shaped like a belt or a band. Over time, it will turn into blisters filled with fluid. The blisters will break open and change into scabs. The scabs will dry up in about 2-3 weeks. You may also have: A fever. Chills. A headache. Nausea. How is this diagnosed? Shingles is diagnosed with a skin exam. A sample called a culture may be taken from one of your blisters and sent to a lab. This will show if you have shingles. How is this treated? The rash may last for several weeks. There's no cure for shingles, but your health care provider may give you medicines.  These medicines may: Help with pain. Help with itching. Help with irritation and swelling. Help you get better sooner. Help to prevent long-term problems. If the rash is on your face, you may need to see an eye doctor or an ear, nose, and throat (ENT) doctor. Follow these instructions at home: Medicines Take your medicines only as told by your provider. Put an anti-itch cream or numbing cream on the rash or blisters as told by your provider. Relieving itching and discomfort  To help with itching: Put cold, wet cloths called cold compresses on the rash or blisters. Take a cool bath. Try adding baking soda or dry oatmeal to the water. Do not bathe in hot water. Use calamine lotion on the rash or blisters. You can get this type of lotion at the store. Blister and rash care Keep your rash covered with a loose bandage. Wear loose clothes that don't rub on your rash. Take care of your rash as told by your provider. Make sure you: Wash your hands with soap and water for at least 20 seconds before and after you change your bandage. If you can't use soap and water, use hand sanitizer. Keep your rash and blisters clean by washing them with mild soap and cool water. Change your bandage. Check your rash every day for signs of infection. Check for: More redness, swelling, or pain. Fluid or blood. Warmth. Pus or a bad smell. Do not scratch your rash. Do not pick at your  blisters. To help you not scratch: Keep your fingernails clean and cut short. Try to wear gloves or mittens when you sleep. General instructions Rest. Wash your hands often with soap and water for at least 20 seconds. If you can't use soap and water, use hand sanitizer. Washing your hands lowers your chance of getting a skin infection. Your infection can cause chickenpox in others. If you have blisters that aren't scabs yet, stay away from: Babies. Pregnant people. Children who have eczema. Older people who have organ  transplants. People who have a long-term, or chronic, illness. Anyone who hasn't had chickenpox before. Anyone who hasn't gotten the chickenpox vaccine. How is this prevented? Vaccines are the best way to prevent you from getting chickenpox or shingles. Talk with your provider about getting these shots. Where to find more information Centers for Disease Control and Prevention (CDC): [employer].no Contact a health care provider if: Your pain doesn't get better with medicine. Your pain doesn't get better after the rash heals. You have any signs of infection around the rash. Your rash or blisters get worse. You have a fever or chills. Get help right away if: The rash is on your face or nose. You have pain in your face or by your eye. You lose feeling on one side of your face. You have trouble seeing. You have ear pain or ringing in your ear. This information is not intended to replace advice given to you by your health care provider. Make sure you discuss any questions you have with your health care provider. Document Revised: 12/08/2022 Document Reviewed: 12/08/2022 Elsevier Patient Education  2024 [employer].

## 2023-11-02 ENCOUNTER — Ambulatory Visit: Payer: 59 | Admitting: Pulmonary Disease

## 2023-11-02 ENCOUNTER — Ambulatory Visit: Payer: 59

## 2023-11-02 ENCOUNTER — Encounter: Payer: Self-pay | Admitting: Pulmonary Disease

## 2023-11-02 VITALS — BP 121/82 | HR 76 | Temp 98.2°F | Ht 68.0 in | Wt 98.2 lb

## 2023-11-02 DIAGNOSIS — J8283 Eosinophilic asthma: Secondary | ICD-10-CM | POA: Diagnosis not present

## 2023-11-02 DIAGNOSIS — J45901 Unspecified asthma with (acute) exacerbation: Secondary | ICD-10-CM | POA: Diagnosis not present

## 2023-11-02 DIAGNOSIS — J329 Chronic sinusitis, unspecified: Secondary | ICD-10-CM | POA: Diagnosis not present

## 2023-11-02 DIAGNOSIS — J45909 Unspecified asthma, uncomplicated: Secondary | ICD-10-CM

## 2023-11-02 LAB — CBC WITH DIFFERENTIAL/PLATELET
Basophils Absolute: 0.1 10*3/uL (ref 0.0–0.1)
Basophils Relative: 0.9 % (ref 0.0–3.0)
Eosinophils Absolute: 1.2 10*3/uL — ABNORMAL HIGH (ref 0.0–0.7)
Eosinophils Relative: 16.1 % — ABNORMAL HIGH (ref 0.0–5.0)
HCT: 44.3 % (ref 36.0–46.0)
Hemoglobin: 15.1 g/dL — ABNORMAL HIGH (ref 12.0–15.0)
Lymphocytes Relative: 28.5 % (ref 12.0–46.0)
Lymphs Abs: 2.1 10*3/uL (ref 0.7–4.0)
MCHC: 34 g/dL (ref 30.0–36.0)
MCV: 102.2 fL — ABNORMAL HIGH (ref 78.0–100.0)
Monocytes Absolute: 0.4 10*3/uL (ref 0.1–1.0)
Monocytes Relative: 5.3 % (ref 3.0–12.0)
Neutro Abs: 3.6 10*3/uL (ref 1.4–7.7)
Neutrophils Relative %: 49.2 % (ref 43.0–77.0)
Platelets: 274 10*3/uL (ref 150.0–400.0)
RBC: 4.33 Mil/uL (ref 3.87–5.11)
RDW: 14.1 % (ref 11.5–15.5)
WBC: 7.3 10*3/uL (ref 4.0–10.5)

## 2023-11-02 MED ORDER — ALBUTEROL SULFATE HFA 108 (90 BASE) MCG/ACT IN AERS
2.0000 | INHALATION_SPRAY | Freq: Four times a day (QID) | RESPIRATORY_TRACT | 11 refills | Status: AC | PRN
Start: 2023-11-02 — End: ?

## 2023-11-02 MED ORDER — BREZTRI AEROSPHERE 160-9-4.8 MCG/ACT IN AERO: 2.0000 | INHALATION_SPRAY | Freq: Two times a day (BID) | RESPIRATORY_TRACT | 11 refills | Status: AC

## 2023-11-02 MED ORDER — PREDNISONE 10 MG PO TABS: ORAL_TABLET | ORAL | 0 refills | Status: AC

## 2023-11-02 NOTE — Progress Notes (Signed)
Synopsis: Acute visit  Subjective:   PATIENT ID: Kathryn Myers GENDER: female DOB: [birth year], MRN: [id number]   HPI  Chief Complaint  Patient presents with   Follow-up   Kathryn Myers is a 62 year old woman, former smoker with GERD and asthma who returns to pulmonary clinic for acute visit.   Her regimen includes breztri 2 puffs twice daily with spacer, albuterol inhaler as needed and sinus rinses as needed. She resumed Breztri inhaler 1 week ago when she had increase in cough, mucous production and dyspnea. She had stopped breztri a few months before as she was doing well.   She  presents with a recurrent cough and shortness of breath, similar to an episode last December that resulted in hospitalization for an acute asthma attack. The patient reports that their symptoms began with reflux last year, which has led to chronic sinus congestion. The patient's breathing difficulties started to worsen in early December, leading to wheezing and sinus discomfort.  The patient's asthma had been well-controlled since March, with no need for the [facility] inhaler or nebulizer. However, around Thanksgiving, the patient began experiencing reflux flare-ups, and within the last three weeks, the patient's breathing difficulties have significantly worsened. The patient is now using the nebulizer every four hours, even waking up in the middle of the night due to breathlessness. The patient is currently on medication for reflux and has been using the Breztri inhaler and nebulizer for about a week due to the recent exacerbation of their symptoms. The patient also reports coughing up stringy mucus and experiencing foamy cough at night, likely related to reflux.  The patient also reports sinus congestion, either being stopped up or having a runny nose. The patient has a history of colitis and had an infection in part of their intestine in late October to early November. In early December, the patient had a mild  shingles flare-up. 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  The patient has had two suspected episodes of COVID-19, one in early 2020 and another around Christmas the same year.   Past Medical History:  Diagnosis Date   Allergic rhinitis    Femoral hernia of right side    Hemorrhoids    Hx of abnormal cervical Pap smear    Osteopenia 11/2012   repeat in 3-5 years   Venereal disease contact    uses condoms, husband is HIV+   Wears glasses      Family History  Problem Relation Age of Onset   Hyperlipidemia Mother    Osteoporosis Mother    Parkinsonism Father    Hyperlipidemia Father    Hypertension Father    Heart disease Father    Cancer Brother        bone cancer as a child   Cancer Paternal Uncle        pancreatic   Heart disease Maternal Grandfather    Osteoporosis Maternal Grandmother    Breast cancer Maternal Aunt 37   Stroke Neg Hx    Diabetes Neg Hx      Social History   Socioeconomic History   Marital status: Married    Spouse name: Not on file   Number of children: Not on file   Years of education: Not on file   Highest education level: Not on file  Occupational History   Occupation: Engineer, manufacturing systems    Employer: [employer]  Tobacco Use   Smoking  status: Former    Current packs/day: 0.00    Average packs/day: 0.3 packs/day for 10.0 years (2.5 ttl pk-yrs)    Types: Cigarettes    Start date: 10/26/1981    Quit date: 10/27/1991    Years since quitting: 32.0   Smokeless tobacco: Never  Vaping Use   Vaping status: Never Used  Substance and Sexual Activity   Alcohol use: Yes    Alcohol/week: 7.0 standard drinks of alcohol    Types: 7 Glasses of wine per week    Comment: social   Drug use: No   Sexual activity: Yes    Birth control/protection: Post-menopausal    Comment: 1st intercourse 11 yo-5 partners  Other Topics Concern   Not on file  Social History Narrative   Married, no  children, exercise - walks the dog, travels a lot for work, Engineer, 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  Travels to [place], [place], [place].     Social Drivers of Corporate investment banker Strain: Not on file  Food Insecurity: No Food Insecurity (11/05/2022)   Hunger Vital Sign    Worried About Running Out of Food in the Last Year: Never true    Ran Out of Food in the Last Year: Never true  Transportation Needs: No Transportation Needs (11/05/2022)   PRAPARE - Administrator, Civil Service (Medical): No    Lack of Transportation (Non-Medical): No  Physical Activity: Not on file  Stress: Not on file  Social Connections: Unknown (03/21/2022)   [facility]   Social Network    Social Network: Not on file  Intimate Partner Violence: Not At Risk (11/05/2022)   Humiliation, Afraid, Rape, and Kick questionnaire    Fear of Current or Ex-Partner: No    Emotionally Abused: No    Physically Abused: No    Sexually Abused: No     No Known Allergies   Outpatient Medications Prior to Visit  Medication Sig Dispense Refill   Calcium Carbonate-Vitamin D 600-200 MG-UNIT TABS Take 2 tablets by mouth daily.     docusate sodium (COLACE) 100 MG capsule Take 100 mg by mouth daily as needed for mild constipation.     famotidine (PEPCID) 20 MG tablet Take 1 tablet (20 mg total) by mouth daily. (Patient taking differently: Take 40 mg by mouth daily.) 30 tablet 2   ipratropium-albuterol (DUONEB) 0.5-2.5 (3) MG/3ML SOLN Take 3 mLs by nebulization every 6 (six) hours as needed. 360 mL 3   Multiple Vitamin (MULTIVITAMIN ADULT PO) Take 1 tablet by mouth daily.     Omega-3 Fatty Acids (SALMON OIL PO) Take 1 capsule by mouth daily.     pantoprazole (PROTONIX) 40 MG tablet Take 1 tablet (40 mg total) by mouth daily. 90 tablet 3   albuterol (VENTOLIN HFA) 108 (90 Base) MCG/ACT inhaler Inhale 2 puffs into the lungs every 6 (six) hours as  needed for wheezing or shortness of breath. 8 g 0   Budeson-Glycopyrrol-Formoterol (BREZTRI AEROSPHERE) 160-9-4.8 MCG/ACT AERO Inhale 2 puffs into the lungs in the morning and at bedtime. 1 each 11   No facility-administered medications prior to visit.   Review of Systems  Constitutional:  Negative for chills, fever, malaise/fatigue and weight loss.  HENT:  Negative for congestion, sinus pain and sore throat.   Eyes: Negative.   Respiratory:  Positive for cough, sputum production, shortness of breath and wheezing. Negative for hemoptysis.   Cardiovascular:  Negative  for chest pain, palpitations, orthopnea, claudication and leg swelling.  Gastrointestinal:  Negative for abdominal pain, heartburn, nausea and vomiting.  Genitourinary: Negative.   Musculoskeletal:  Negative for joint pain and myalgias.  Skin:  Negative for rash.  Neurological:  Negative for weakness.  Endo/Heme/Allergies: Negative.   Psychiatric/Behavioral: Negative.      Objective:   Vitals:   11/02/23 0907  BP: 121/82  Pulse: 76  Temp: 98.2 F (36.8 C)  TempSrc: Oral  SpO2: 98%  Weight: 98 lb 3.2 oz (44.5 kg)  Height: 5\' 8"  (1.727 m)   Physical Exam Constitutional:      General: She is not in acute distress.    Appearance: Normal appearance.  Eyes:     General: No scleral icterus.    Conjunctiva/sclera: Conjunctivae normal.  Cardiovascular:     Rate and Rhythm: Normal rate and regular rhythm.  Pulmonary:     Breath sounds: No wheezing, rhonchi or rales.  Musculoskeletal:     Right lower leg: No edema.     Left lower leg: No edema.  Skin:    General: Skin is warm and dry.  Neurological:     General: No focal deficit present.    CBC    Component Value Date/Time   WBC 12.4 (H) 09/08/2023 1624   RBC 4.43 09/08/2023 1624   HGB 14.9 09/08/2023 1624   HGB 15.5 11/08/2020 1234   HCT 42.9 09/08/2023 1624   HCT 45.7 11/08/2020 1234   PLT 293 09/08/2023 1624   PLT 272 11/08/2020 1234   MCV 96.8  09/08/2023 1624   MCV 96 11/08/2020 1234   MCH 33.6 09/08/2023 1624   MCHC 34.7 09/08/2023 1624   RDW 12.6 09/08/2023 1624   RDW 12.3 11/08/2020 1234   LYMPHSABS 2.7 01/26/2023 1358   LYMPHSABS 2.6 11/08/2020 1234   MONOABS 0.3 01/26/2023 1358   EOSABS 0.9 (H) 01/26/2023 1358   EOSABS 0.2 11/08/2020 1234   BASOSABS 0.0 01/26/2023 1358   BASOSABS 0.1 11/08/2020 1234      Latest Ref Rng & Units 09/08/2023    4:24 PM 11/07/2022    4:04 AM 11/06/2022    4:32 AM  BMP  Glucose 70 - 99 mg/dL 829  562  130   BUN 8 - 23 mg/dL 19  27  17    Creatinine 0.44 - 1.00 mg/dL 8.65  7.84  6.96   Sodium 135 - 145 mmol/L 137  134  138   Potassium 3.5 - 5.1 mmol/L 3.8  4.0  4.4   Chloride 98 - 111 mmol/L 102  104  108   CO2 22 - 32 mmol/L 26  23  22    Calcium 8.9 - 10.3 mg/dL 9.5  8.4  8.7    Chest imaging:   PFT:    Latest Ref Rng & Units 01/25/2023    1:46 PM  PFT Results  FVC-Pre L 3.21   FVC-Predicted Pre % 86   FVC-Post L 3.45   FVC-Predicted Post % 92   Pre FEV1/FVC % % 39   Post FEV1/FCV % % 59   FEV1-Pre L 1.26   FEV1-Predicted Pre % 43   FEV1-Post L 2.05   DLCO uncorrected ml/min/mmHg 22.18   DLCO UNC% % 97   DLCO corrected ml/min/mmHg 22.18   DLCO COR %Predicted % 97   DLVA Predicted % 104   TLC L 5.86   TLC % Predicted % 103   RV % Predicted % 111     Labs:  Path:  Echo:  Heart Catheterization:     Assessment & Plan:   Eosinophilic asthma - Plan: DG Chest 2 View, CBC with Differential/Platelet, IgE, Budeson-Glycopyrrol-Formoterol (BREZTRI AEROSPHERE) 160-9-4.8 MCG/ACT AERO, albuterol (VENTOLIN HFA) 108 (90 Base) MCG/ACT inhaler, IgE, CBC with Differential/Platelet  Chronic rhinosinusitis  Discussion: Kathryn Myers is a 62 year old woman, former smoker with GERD and asthma who returns to pulmonary clinic for acute visit.   Asthma exacerbation Recurrent symptoms similar to previous episode requiring hospitalization. Increased use of nebulizer and Breztri  inhaler. History of high eosinophil count. -Order chest x-ray and blood counts to assess current eosinophil and IgE levels. -Prescribe a steroid taper starting at 40mg  daily for three days, then reducing by 10mg  every three days for a total of 12 days. -Continue Breztri inhaler for the next 2-3 months, then reassess. -Plan for prophylactic use of Breztri inhaler starting in November next year to prevent winter exacerbations. -Consider CT scan depending on chest x-ray results.  Gastroesophageal Reflux Disease (GERD) History of Laryngopharyngeal reflux (LPR) with recent flare-ups. Symptoms include sinus congestion and foamy regurgitation at night. -Continue current medication for reflux.  Sinus congestion Chronic sinus issues with alternating congestion and runny nose. Possible contribution to asthma exacerbation. -steroid taper as above  Medication refills Patient has sufficient nebulizer solution but may require refills of Breztri inhaler and emergency inhaler. -Send in refills for [facility] inhaler and emergency inhaler.  Follow-up Schedule follow-up in 6 months or sooner if symptoms do not improve after steroid taper.  Melody Comas, MD [facility] Office: [phone]   Current Outpatient Medications:    Calcium Carbonate-Vitamin D 600-200 MG-UNIT TABS, Take 2 tablets by mouth daily., Disp: , Rfl:    docusate sodium (COLACE) 100 MG capsule, Take 100 mg by mouth daily as needed for mild constipation., Disp: , Rfl:    famotidine (PEPCID) 20 MG tablet, Take 1 tablet (20 mg total) by mouth daily. (Patient taking differently: Take 40 mg by mouth daily.), Disp: 30 tablet, Rfl: 2   ipratropium-albuterol (DUONEB) 0.5-2.5 (3) MG/3ML SOLN, Take 3 mLs by nebulization every 6 (six) hours as needed., Disp: 360 mL, Rfl: 3   Multiple Vitamin (MULTIVITAMIN ADULT PO), Take 1 tablet by mouth daily., Disp: , Rfl:    Omega-3 Fatty Acids (SALMON OIL PO), Take 1 capsule by mouth  daily., Disp: , Rfl:    pantoprazole (PROTONIX) 40 MG tablet, Take 1 tablet (40 mg total) by mouth daily., Disp: 90 tablet, Rfl: 3   albuterol (VENTOLIN HFA) 108 (90 Base) MCG/ACT inhaler, Inhale 2 puffs into the lungs every 6 (six) hours as needed for wheezing or shortness of breath., Disp: 8 g, Rfl: 11   Budeson-Glycopyrrol-Formoterol (BREZTRI AEROSPHERE) 160-9-4.8 MCG/ACT AERO, Inhale 2 puffs into the lungs in the morning and at bedtime., Disp: 1 each, Rfl: 11

## 2023-11-02 NOTE — Patient Instructions (Addendum)
Continue breztri inhaler 2 puffs twice daily - rinse mouth out after each use  Use albuterol inhaler or duoneb nebulizer treatments as needed every 4-6 hours  Start prednisone taper 40mg  daily x 3 days 30mg  daily x 3 days 20mg  daily x 3 days 10mg  daily x 3 days  We will check lab work and a chest x-ray today  Follow up in 6 months, call sooner if needed

## 2023-11-02 NOTE — Addendum Note (Signed)
Addended by: Luciano Cutter on: 11/02/2023 03:19 PM   Modules accepted: Orders

## 2023-11-02 NOTE — Progress Notes (Signed)
[facility] After Hours Telephone Encounter  Prednisone was discussed during clinic but not called in.  Prednisone taper ordered per clinic instructions. Patient expressed appreciation.

## 2023-11-05 LAB — IGE: IgE (Immunoglobulin E), Serum: 121 kU/L — ABNORMAL HIGH (ref <=114)

## 2023-11-12 ENCOUNTER — Encounter: Payer: Self-pay | Admitting: Pulmonary Disease

## 2024-01-01 ENCOUNTER — Encounter: Payer: Self-pay | Admitting: Internal Medicine

## 2024-02-26 LAB — HM COLONOSCOPY

## 2024-04-30 ENCOUNTER — Encounter: Payer: Self-pay | Admitting: Pulmonary Disease

## 2024-04-30 ENCOUNTER — Ambulatory Visit: Payer: 59 | Admitting: Pulmonary Disease

## 2024-04-30 VITALS — BP 128/82 | HR 60 | Ht 68.0 in | Wt 99.0 lb

## 2024-04-30 DIAGNOSIS — J8283 Eosinophilic asthma: Secondary | ICD-10-CM | POA: Diagnosis not present

## 2024-04-30 DIAGNOSIS — D7218 Eosinophilia in diseases classified elsewhere: Secondary | ICD-10-CM | POA: Diagnosis not present

## 2024-04-30 DIAGNOSIS — J45909 Unspecified asthma, uncomplicated: Secondary | ICD-10-CM

## 2024-04-30 DIAGNOSIS — K2980 Duodenitis without bleeding: Secondary | ICD-10-CM | POA: Diagnosis not present

## 2024-04-30 LAB — CBC WITH DIFFERENTIAL/PLATELET
Basophils Absolute: 0.1 10*3/uL (ref 0.0–0.1)
Basophils Relative: 1 % (ref 0.0–3.0)
Eosinophils Absolute: 0.3 10*3/uL (ref 0.0–0.7)
Eosinophils Relative: 5.3 % — ABNORMAL HIGH (ref 0.0–5.0)
HCT: 43.2 % (ref 36.0–46.0)
Hemoglobin: 14.5 g/dL (ref 12.0–15.0)
Lymphocytes Relative: 45.1 % (ref 12.0–46.0)
Lymphs Abs: 2.5 10*3/uL (ref 0.7–4.0)
MCHC: 33.5 g/dL (ref 30.0–36.0)
MCV: 98 fl (ref 78.0–100.0)
Monocytes Absolute: 0.3 10*3/uL (ref 0.1–1.0)
Monocytes Relative: 5.9 % (ref 3.0–12.0)
Neutro Abs: 2.4 10*3/uL (ref 1.4–7.7)
Neutrophils Relative %: 42.7 % — ABNORMAL LOW (ref 43.0–77.0)
Platelets: 274 10*3/uL (ref 150.0–400.0)
RBC: 4.4 Mil/uL (ref 3.87–5.11)
RDW: 13.6 % (ref 11.5–15.5)
WBC: 5.6 10*3/uL (ref 4.0–10.5)

## 2024-04-30 NOTE — Progress Notes (Signed)
 Synopsis: Acute visit  Subjective:   PATIENT ID: Kathryn Myers GENDER: female DOB: 1961/09/22, MRN: [id number]   HPI  Chief Complaint  Patient presents with   Follow-up   Kathryn Myers is a 62 year old woman, former smoker with GERD and asthma who returns to pulmonary clinic for acute visit.   OV 11/02/23 Her regimen includes breztri  2 puffs twice daily with spacer, albuterol  inhaler as needed and sinus rinses as needed. She resumed Breztri  inhaler 1 week ago when she had increase in cough, mucous production and dyspnea. She had stopped breztri  a few months before as she was doing well.   She  presents with a recurrent cough and shortness of breath, similar to an episode last December that resulted in hospitalization for an acute asthma attack. The patient reports that their symptoms began with reflux last year, which has led to chronic sinus congestion. The patient's breathing difficulties started to worsen in early December, leading to wheezing and sinus discomfort.  The patient's asthma had been well-controlled since March, with no need for the Breztri  inhaler or nebulizer. However, around Thanksgiving, the patient began experiencing reflux flare-ups, and within the last three weeks, the patient's breathing difficulties have significantly worsened. The patient is now using the nebulizer every four hours, even waking up in the middle of the night due to breathlessness. The patient is currently on medication for reflux and has been using the Breztri  inhaler and nebulizer for about a week due to the recent exacerbation of their symptoms. The patient also reports coughing up stringy mucus and experiencing foamy cough at night, likely related to reflux.  The patient also reports sinus congestion, either being stopped up or having a runny nose. The patient has a history of colitis and had an infection in part of their intestine in late October to early November. In early December, the patient  had a mild shingles flare-up. 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  The patient has had two suspected episodes of COVID-19, one in early 2020 and another around Christmas the same year.    OV 04/30/24 Eosinophil levels have been consistently high, with blood work in March and early April confirming this. She experiences recurrent vomiting episodes, often at night, since March of last year. She had EGD and colonosocpy in April which  showed eosinophilic inflammation in the duodenum but not stomach or colon.  During flare-ups, she experiences diarrhea and mucus production. Her breathing was stable over the summer, with no respiratory symptoms reported. She uses prednisone  during flare-ups and is concerned about long-term steroid use due to osteopenia and a family history of osteoporosis. She takes Protonix  for acid reflux.  Past Medical History:  Diagnosis Date   Allergic rhinitis    Femoral hernia of right side    Hemorrhoids    Hx of abnormal cervical Pap smear    Osteopenia 11/2012   repeat in 3-5 years   Venereal disease contact    uses condoms, husband is HIV+   Wears glasses      Family History  Problem Relation Age of Onset   Hyperlipidemia Mother    Osteoporosis Mother    Parkinsonism Father    Hyperlipidemia Father    Hypertension Father    Heart disease Father    Cancer Brother        bone cancer as a child   Cancer Paternal Uncle  pancreatic   Heart disease Maternal Grandfather    Osteoporosis Maternal Grandmother    Breast cancer Maternal Aunt 80   Stroke Neg Hx    Diabetes Neg Hx      Social History   Socioeconomic History   Marital status: Married    Spouse name: Not on file   Number of children: Not on file   Years of education: Not on file   Highest education level: Not on file  Occupational History   Occupation: Engineer, manufacturing systems    Employer: [employer]  Tobacco Use    Smoking status: Former    Current packs/day: 0.00    Average packs/day: 0.3 packs/day for 10.0 years (2.5 ttl pk-yrs)    Types: Cigarettes    Start date: 10/26/1981    Quit date: 10/27/1991    Years since quitting: 32.5   Smokeless tobacco: Never  Vaping Use   Vaping status: Never Used  Substance and Sexual Activity   Alcohol use: Yes    Alcohol/week: 7.0 standard drinks of alcohol    Types: 7 Glasses of wine per week    Comment: social   Drug use: No   Sexual activity: Yes    Birth control/protection: Post-menopausal    Comment: 1st intercourse 61 yo-5 partners  Other Topics Concern   Not on file  Social History Narrative   Married, no children, exercise - walks the dog, travels a lot for work, Engineer, 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  Travels to [place], [place] .     Social Drivers of Corporate investment banker Strain: Not on file  Food Insecurity: No Food Insecurity (11/05/2022)   Hunger Vital Sign    Worried About Running Out of Food in the Last Year: Never true    Ran Out of Food in the Last Year: Never true  Transportation Needs: No Transportation Needs (11/05/2022)   PRAPARE - Administrator, Civil Service (Medical): No    Lack of Transportation (Non-Medical): No  Physical Activity: Not on file  Stress: Not on file  Social Connections: Unknown (03/21/2022)   Received from [facility]   Social Network    Social Network: Not on file  Intimate Partner Violence: Not At Risk (11/05/2022)   Humiliation, Afraid, Rape, and Kick questionnaire    Fear of Current or Ex-Partner: No    Emotionally Abused: No    Physically Abused: No    Sexually Abused: No     No Known Allergies   Outpatient Medications Prior to Visit  Medication Sig Dispense Refill   albuterol  (VENTOLIN  HFA) 108 (90 Base) MCG/ACT inhaler Inhale 2 puffs into the lungs every 6 (six) hours as needed for wheezing or shortness of breath. 8 g 11    Budeson-Glycopyrrol-Formoterol (BREZTRI  AEROSPHERE) 160-9-4.8 MCG/ACT AERO Inhale 2 puffs into the lungs in the morning and at bedtime. 1 each 11   Calcium Carbonate-Vitamin D  600-200 MG-UNIT TABS Take 2 tablets by mouth daily.     docusate sodium  (COLACE) 100 MG capsule Take 100 mg by mouth daily as needed for mild constipation.     famotidine  (PEPCID ) 20 MG tablet Take 1 tablet (20 mg total) by mouth daily. (Patient taking differently: Take 40 mg by mouth daily.) 30 tablet 2   ipratropium-albuterol  (DUONEB) 0.5-2.5 (3) MG/3ML SOLN Take 3 mLs by nebulization every 6 (six) hours as needed. 360 mL 3   Multiple Vitamin (MULTIVITAMIN ADULT PO) Take 1 tablet by mouth daily.  Omega-3 Fatty Acids (SALMON OIL PO) Take 1 capsule by mouth daily.     pantoprazole  (PROTONIX ) 40 MG tablet Take 1 tablet (40 mg total) by mouth daily. 90 tablet 3   No facility-administered medications prior to visit.   Review of Systems  Constitutional:  Negative for chills, fever, malaise/fatigue and weight loss.  HENT:  Negative for congestion, sinus pain and sore throat.   Eyes: Negative.   Respiratory:  Negative for cough, hemoptysis, sputum production, shortness of breath and wheezing.   Cardiovascular:  Negative for chest pain, palpitations, orthopnea, claudication and leg swelling.  Gastrointestinal:  Negative for abdominal pain, heartburn, nausea and vomiting.  Genitourinary: Negative.   Musculoskeletal:  Negative for joint pain and myalgias.  Skin:  Negative for rash.  Neurological:  Negative for weakness.  Endo/Heme/Allergies: Negative.   Psychiatric/Behavioral: Negative.      Objective:   Vitals:   04/30/24 1307  BP: 128/82  Pulse: 60  SpO2: 98%  Weight: 99 lb (44.9 kg)  Height: 5' 8 (1.727 m)   Physical Exam Constitutional:      General: She is not in acute distress.    Appearance: Normal appearance.   Eyes:     General: No scleral icterus.    Conjunctiva/sclera: Conjunctivae normal.     Cardiovascular:     Rate and Rhythm: Normal rate and regular rhythm.  Pulmonary:     Breath sounds: No wheezing, rhonchi or rales.   Musculoskeletal:     Right lower leg: No edema.     Left lower leg: No edema.   Skin:    General: Skin is warm and dry.   Neurological:     General: No focal deficit present.    CBC    Component Value Date/Time   WBC 7.3 11/02/2023 0944   RBC 4.33 11/02/2023 0944   HGB 15.1 (H) 11/02/2023 0944   HGB 15.5 11/08/2020 1234   HCT 44.3 11/02/2023 0944   HCT 45.7 11/08/2020 1234   PLT 274.0 11/02/2023 0944   PLT 272 11/08/2020 1234   MCV 102.2 (H) 11/02/2023 0944   MCV 96 11/08/2020 1234   MCH 33.6 09/08/2023 1624   MCHC 34.0 11/02/2023 0944   RDW 14.1 11/02/2023 0944   RDW 12.3 11/08/2020 1234   LYMPHSABS 2.1 11/02/2023 0944   LYMPHSABS 2.6 11/08/2020 1234   MONOABS 0.4 11/02/2023 0944   EOSABS 1.2 (H) 11/02/2023 0944   EOSABS 0.2 11/08/2020 1234   BASOSABS 0.1 11/02/2023 0944   BASOSABS 0.1 11/08/2020 1234      Latest Ref Rng & Units 09/08/2023    4:24 PM 11/07/2022    4:04 AM 11/06/2022    4:32 AM  BMP  Glucose 70 - 99 mg/dL 830  899  836   BUN 8 - 23 mg/dL 19  27  17    Creatinine 0.44 - 1.00 mg/dL 9.13  9.14  9.36   Sodium 135 - 145 mmol/L 137  134  138   Potassium 3.5 - 5.1 mmol/L 3.8  4.0  4.4   Chloride 98 - 111 mmol/L 102  104  108   CO2 22 - 32 mmol/L 26  23  22    Calcium 8.9 - 10.3 mg/dL 9.5  8.4  8.7    Chest imaging: CXR 11/02/23 Reticulonodular opacities overlying the cardiac silhouette on the lateral view, potentially broncho pneumonia versus scarring.   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  Eosinophilic Asthma She has ongoing ashtma symptoms in setting of high absolute eosinophil counts. -Continue Breztri  inhaler 2 puffs twice daily -re-check CBC with diff, IgE, ANA and ANCA levels - plan to start Dupixent therapy  Eosinophilic Duodenitis EGD proven via biopsies. Episodes of Nausea vomiting and mucous stools. - Dupixent therapy as above will help with this - followed by Dr. Rollin of GI  Gastroesophageal Reflux Disease (GERD) History of Laryngopharyngeal reflux (LPR) with recent flare-ups. Symptoms include sinus congestion and foamy regurgitation at night. -Continue current medication for reflux.  Sinus congestion - stable at this time  Follow up in 3 months  Dorn Chill, MD [facility] Office: [phone]   Current Outpatient Medications:    albuterol  (VENTOLIN  HFA) 108 (90 Base) MCG/ACT inhaler, Inhale 2 puffs into the lungs every 6 (six) hours as needed for wheezing or shortness of breath., Disp: 8 g, Rfl: 11   Budeson-Glycopyrrol-Formoterol (BREZTRI  AEROSPHERE) 160-9-4.8 MCG/ACT AERO, Inhale 2 puffs into the lungs in the morning and at bedtime., Disp: 1 each, Rfl: 11   Calcium Carbonate-Vitamin D  600-200 MG-UNIT TABS, Take 2 tablets by mouth daily., Disp: , Rfl:    docusate sodium  (COLACE) 100 MG capsule, Take 100 mg by mouth daily as needed for mild constipation., Disp: , Rfl:    famotidine  (PEPCID ) 20 MG tablet, Take 1 tablet (20 mg total) by mouth  daily. (Patient taking differently: Take 40 mg by mouth daily.), Disp: 30 tablet, Rfl: 2   ipratropium-albuterol  (DUONEB) 0.5-2.5 (3) MG/3ML SOLN, Take 3 mLs by nebulization every 6 (six) hours as needed., Disp: 360 mL, Rfl: 3   Multiple Vitamin (MULTIVITAMIN ADULT PO), Take 1 tablet by mouth daily., Disp: , Rfl:    Omega-3 Fatty Acids (SALMON OIL PO), Take 1 capsule by mouth daily., Disp: , Rfl:    pantoprazole  (PROTONIX ) 40 MG tablet, Take 1 tablet (40 mg total) by mouth daily., Disp: 90 tablet, Rfl: 3

## 2024-04-30 NOTE — Patient Instructions (Addendum)
 We will work on getting you setup with dupixent injections for your asthma and eosinophilic duodenitis  Continue Breztri  inhaler 2 puffs twice daily - rinse mouth  Follow up in 3 months

## 2024-05-01 ENCOUNTER — Telehealth: Payer: Self-pay

## 2024-05-01 NOTE — Telephone Encounter (Signed)
 Received new start paperwork for Dupixent  Submitted a Prior Authorization request to [employer] for DUPIXENT via CoverMyMeds. Will update once we receive a response.  Key: [id number]

## 2024-05-02 ENCOUNTER — Telehealth: Payer: Self-pay

## 2024-05-02 NOTE — Telephone Encounter (Unsigned)
 Copied from CRM [id number]. Topic: Clinical - Prescription Issue >> May 02, 2024  1:56 PM Isabell A wrote: Reason for CRM: Almarie from prior authorization dept from [facility] in regard to Dupixent - has been approved.  CASE ID# [id number]

## 2024-05-02 NOTE — Telephone Encounter (Signed)
eRROR 

## 2024-05-05 ENCOUNTER — Other Ambulatory Visit (HOSPITAL_COMMUNITY): Payer: Self-pay

## 2024-05-05 NOTE — Telephone Encounter (Signed)
 Received notification from [facility] regarding a prior authorization for DUPIXENT. Authorization has been APPROVED from 05/01/2024 to 05/01/2025. Approval letter sent to scan center.  Unable to run test claim because patient must fill through [facility]: [phone]   Authorization # [id number]  Activated Dupixent copay card via phone today: ID: [id number] BIN: [id number] PCN: [id number] Group: [id number]  ATC patient to schedule Dupixent new start. Unable to reach. Left VM with my callback number  Sherry Pennant, PharmD, MPH, BCPS, CPP Clinical Pharmacist (Rheumatology and Pulmonology)

## 2024-05-06 NOTE — Telephone Encounter (Signed)
 ATC # 2 patient to schedule for Dupixent new start. She states that has a f/u appt with GI on 05/12/2024 and would like GI's input as well  She is also waiting to hear back about the autoimmune labs that were completed. ANA was positive and ANA pattern is nuclear speckled. ANCA screen with reflex is pending  Will f/u in 10-14 days  Sherry Pennant, PharmD, MPH, BCPS, CPP Clinical Pharmacist (Rheumatology and Pulmonology)

## 2024-05-07 LAB — ANTI-NUCLEAR AB-TITER (ANA TITER)
ANA TITER: 1:1280 {titer} — ABNORMAL HIGH
ANA Titer 1: 1:80 {titer} — ABNORMAL HIGH

## 2024-05-07 LAB — ANCA SCREEN W REFLEX TITER: ANCA SCREEN: NEGATIVE

## 2024-05-07 LAB — ANA: Anti Nuclear Antibody (ANA): POSITIVE — AB

## 2024-05-15 ENCOUNTER — Encounter: Payer: Self-pay | Admitting: Pulmonary Disease

## 2024-05-15 DIAGNOSIS — R768 Other specified abnormal immunological findings in serum: Secondary | ICD-10-CM

## 2024-05-19 NOTE — Telephone Encounter (Signed)
 Called patient regarding Dupixent new start appointment. Left voicemail.   Deleta Colt PharmD Candidate [phone]  [facility]

## 2024-05-19 NOTE — Telephone Encounter (Addendum)
 Autoimmune labs not concerning. Per review of GI notes in CareEverywhere, they recommend a trial of Dupixent. Will need to reach back out to patient to assess if ready to start Dupixent

## 2024-05-20 NOTE — Telephone Encounter (Signed)
Please advise on pt questions.

## 2024-05-21 NOTE — Telephone Encounter (Signed)
 Patient returned call - she is waiting to see if she needs rheumatology referral

## 2024-05-21 NOTE — Telephone Encounter (Signed)
 ATC # 2 regaridng Dupixent new start. Unable to reach left VM

## 2024-05-26 NOTE — Telephone Encounter (Signed)
 Received VM from patient that Dr. Kara has referred to patient to rheumatology and agreed with holding on Dupixent initiation until further workup. Will close encounter for now and await further communication either from patient or Dr. Kara Sherry Pennant, PharmD, MPH, BCPS, CPP Clinical Pharmacist (Rheumatology and Pulmonology)

## 2024-07-03 ENCOUNTER — Ambulatory Visit

## 2024-07-03 VITALS — BP 119/78 | HR 71 | Resp 12 | Ht 67.0 in | Wt 101.8 lb

## 2024-07-03 DIAGNOSIS — R898 Other abnormal findings in specimens from other organs, systems and tissues: Secondary | ICD-10-CM

## 2024-07-03 DIAGNOSIS — R768 Other specified abnormal immunological findings in serum: Secondary | ICD-10-CM | POA: Diagnosis not present

## 2024-07-03 DIAGNOSIS — J8283 Eosinophilic asthma: Secondary | ICD-10-CM

## 2024-07-03 DIAGNOSIS — I7300 Raynaud's syndrome without gangrene: Secondary | ICD-10-CM | POA: Diagnosis not present

## 2024-07-03 DIAGNOSIS — J4530 Mild persistent asthma, uncomplicated: Secondary | ICD-10-CM

## 2024-07-03 NOTE — Progress Notes (Signed)
 Office Visit Note  Patient: Kathryn Myers             Date of Birth: 1960/11/08           MRN: [id number]             PCP: Joyce Norleen BROCKS, MD Referring: Kara Dorn NOVAK, MD Visit Date: 07/03/2024  Subjective:  New Patient (Initial Visit) (Patient states she had abnormal labs. Patient states she has raynaud's and eosinophilia.  )   History of Present Illness: Kathryn Myers is a 63 y.o. female presenting with high eosinophils.  She states that she developed cough and shortness of breath in 2023. She never had breathing issues prior to that year. In December 2023, she landed in the hospital due to the her breathing, where she was found to have elevated eosinophils.   She admits to occasional sinus infections, but she does feel like her nose is frequently congested.   She admits to frequent episodes of nausea and vomiting, in which she was unable to keep food down. 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   She admits to issues with mucous in her stool when she was having GI symptoms.   She admits to hx of Raynaud's that started 4 years ago. She describes tri-color change, worse in the winter.   She denies rashes, dry eyes, dry mouth, fevers, alopecia, photosensitivity. She denies any symptoms of numbness/tingling concerning for mononeuritis multiplex.   She admits to some stiffness in her entire leg bilaterally in the AM, especially when going down the stairs. She admits this last for ~1 hour. She also admits to stiffness in her bilateral shoulders recently. She denies any joint pain. She admits to joint swelling, and notices she cannot get her ring off sometimes. She thinks that this may be due to the heat.   Activities of Daily Living:  Patient reports morning stiffness for 1-2 hours.   Patient Denies nocturnal pain.  Difficulty dressing/grooming: Denies Difficulty climbing stairs: Denies Difficulty getting out of chair: Denies Difficulty  using hands for taps, buttons, cutlery, and/or writing: Denies  Review of Systems  Constitutional:  Negative for fatigue.  HENT:  Negative for mouth sores and mouth dryness.   Eyes:  Negative for dryness.  Respiratory:  Positive for shortness of breath.   Cardiovascular:  Negative for chest pain and palpitations.  Gastrointestinal:  Negative for blood in stool, constipation and diarrhea.  Endocrine: Negative for increased urination.  Genitourinary:  Negative for involuntary urination.  Musculoskeletal:  Positive for morning stiffness. Negative for joint pain, gait problem, joint pain, joint swelling, myalgias, muscle weakness, muscle tenderness and myalgias.  Skin:  Positive for color change. Negative for rash, hair loss and sensitivity to sunlight.  Allergic/Immunologic: Negative for susceptible to infections.  Neurological:  Negative for dizziness and headaches.  Hematological:  Negative for swollen glands.  Psychiatric/Behavioral:  Negative for depressed mood and sleep disturbance. The patient is not nervous/anxious.     PMFS History:  Patient Active Problem List   Diagnosis Date Noted   Hiatal hernia 12/12/2022   Acute asthma exacerbation 11/05/2022   Hypokalemia 11/05/2022   Acute respiratory failure with hypoxia (HCC) 11/05/2022   Mild intermittent asthma with exacerbation 09/06/2022   Gastroesophageal reflux disease 09/06/2022   Enlarged lymph nodes 01/02/2020   Lymphadenopathy, inguinal 01/02/2020   Routine general medical examination at a health care facility 09/24/2017   Weight loss 09/24/2017   Screening for cervical cancer  09/24/2017   Vaccine counseling 09/24/2017   Non-recurrent unilateral inguinal hernia without obstruction or gangrene 09/24/2017   Cervical polyp 09/24/2017   Screen for STD (sexually transmitted disease) 09/24/2017   Encounter for health maintenance examination in adult 10/25/2015   Rhinitis, allergic 10/25/2015   Venereal disease contact  10/25/2015   Need for prophylactic vaccination and inoculation against influenza 10/25/2015   Edema 10/25/2015   BMI less than 19,adult 10/25/2015   LLQ abdominal tenderness 10/25/2015   Osteopenia 11/02/2014    Past Medical History:  Diagnosis Date   Allergic rhinitis    Eosinophilic asthma    Eosinophilic duodenitis    Femoral hernia of right side    Hemorrhoids    Hx of abnormal cervical Pap smear    Osteopenia 11/2012   repeat in 3-5 years   Raynaud phenomenon    Venereal disease contact    uses condoms, husband is HIV+   Wears glasses     Family History  Problem Relation Age of Onset   Hyperlipidemia Mother    Osteoporosis Mother    Parkinsonism Father    Hyperlipidemia Father    Hypertension Father    Heart disease Father    Cancer Brother        bone cancer as a child   Osteoporosis Maternal Grandmother    Heart disease Maternal Grandfather    Breast cancer Maternal Aunt 80   Cancer Paternal Uncle        pancreatic   Stroke Neg Hx    Diabetes Neg Hx    Past Surgical History:  Procedure Laterality Date   COLONOSCOPY  12/2009   Dr. Rollin; sessile polyp, medium hemorrhoids, diverticulum   FEMORAL HERNIA REPAIR Right 10/22/2018   Procedure: RIGHT FEMORAL HERNIA REPAIR WITH MESH;  Surgeon: Belinda Cough, MD;  Location: [facility];  Service: General;  Laterality: Right;   INGUINAL HERNIA REPAIR  2008   left   INSERTION OF MESH Right 10/22/2018   Procedure: INSERTION OF MESH;  Surgeon: Belinda Cough, MD;  Location: [facility];  Service: General;  Laterality: Right;   SKIN BIOPSY Left 10/20/2021   compond nevus with moderate atypia   UPPER GI ENDOSCOPY     Social History   Social History Narrative   Married, no children, exercise - walks the dog, travels a lot for work, Engineer, 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  Travels to [place], [place], [place] .     Immunization History  Administered Date(s)  Administered   DT (Pediatric) 12/05/1994, 06/07/2003   Hepatitis A 05/26/2005, 08/30/2012   Hepatitis B 05/26/2005, 06/23/2005, 12/25/2005   Influenza Inj Mdck Quad Pf 09/04/2017   Influenza Split 10/27/2010, 10/27/2011, 08/30/2012   Influenza, Seasonal, Injecte, Preservative Fre 08/21/2023   Influenza,inj,Quad PF,6+ Mos 10/27/2013, 11/02/2014, 10/25/2015, 09/27/2018, 08/08/2022   Influenza-Unspecified 09/04/2017, 08/25/2021   PFIZER(Purple Top)SARS-COV-2 Vaccination 01/10/2020, 02/03/2020, 10/14/2020, 05/12/2021   Pfizer(Comirnaty)Fall Seasonal Vaccine 12 years and older 09/06/2022, 08/21/2023   Tdap 08/30/2012, 10/09/2022     Objective: Vital Signs: BP 119/78 (BP Location: Right Arm, Patient Position: Sitting, Cuff Size: Small)   Pulse 71   Resp 12   Ht 5' 7 (1.702 m)   Wt 101 lb 12.8 oz (46.2 kg)   LMP 07/28/2011   BMI 15.94 kg/m    Physical Exam Vitals and nursing note reviewed.  HENT:     Head: Normocephalic and atraumatic.     Nose: Nose normal.  Eyes:  Conjunctiva/sclera: Conjunctivae normal.     Pupils: Pupils are equal, round, and reactive to light.  Cardiovascular:     Rate and Rhythm: Normal rate and regular rhythm.     Heart sounds: Normal heart sounds.  Pulmonary:     Effort: Pulmonary effort is normal.     Breath sounds: Normal breath sounds.  Skin:    General: Skin is warm and dry.     Comments: nailfold capillaroscopy w/ small micro-hemorrhages present on multiple nailfold's, one dilated capillary present left 5th digit.  Neurological:     Mental Status: She is alert. Mental status is at baseline.  Psychiatric:        Mood and Affect: Mood normal.        Behavior: Behavior normal.      Musculoskeletal Exam:   CDAI Exam: CDAI Score: -- Patient Global: --; Provider Global: -- Swollen: --; Tender: -- Joint Exam 07/03/2024   No joint exam has been documented for this visit   There is currently no information documented on the homunculus. Go  to the Rheumatology activity and complete the homunculus joint exam.  Investigation: No additional findings.  Imaging: No results found.  Recent Labs: Lab Results  Component Value Date   WBC 5.6 04/30/2024   HGB 14.5 04/30/2024   PLT 274.0 04/30/2024   NA 137 09/08/2023   K 3.8 09/08/2023   CL 102 09/08/2023   CO2 26 09/08/2023   GLUCOSE 169 (H) 09/08/2023   BUN 19 09/08/2023   CREATININE 0.86 09/08/2023   BILITOT 0.9 09/08/2023   ALKPHOS 40 09/08/2023   AST 19 09/08/2023   ALT 12 09/08/2023   PROT 6.7 09/08/2023   ALBUMIN 3.9 09/08/2023   CALCIUM 9.5 09/08/2023   GFRAA 79 11/08/2020    Speciality Comments: No specialty comments available.  Procedures:  No procedures performed Allergies: Patient has no known allergies.   Assessment / Plan:     Visit Diagnoses:  #Positive ANA (antinuclear antibody) #Raynaud's disease w/o gangrene Patient with high titer positive ANA 1:1280 w/ hx of acute onset Raynaud's 4 years ago. She denies any associated digital ulceration or gangrene.  Patient does not have any features of SLE (no objective malar rash, inflammatory arthritis, alopecia, recurrent cytopenias), Sjogren's disease (no sicca symptoms). She also has no sclerodactyly concerning for a scleroderma. Given her acute onset of Raynaud's at age >71 y.o, will obtain full ENA panel, as well as scleroderma abs panel.   As discussed with patient, a positive ANA need not represent presence of clinically active systemic autoimmune disease.  Can be positive in the normal population, autoimmune thyroid  disease (Graves' disease, Hashimoto's thyroiditis etc.), or infection. ANA can be positive in the healthy population at the following rates: ANA 1:40: 20%-30%, ANA 1:80: 10%-15%, ANA 1:160: 5%, ANA 1:320: 3% positive, healthy relative of an SLE patient: 5%-25% positive (usually low titers), and elderly (age >70 years): up to 70% positive at ANA titer 1:40. Given patient's high titer positive,  will plan to monitor patient with closely scheduled follow-up's.  Patient admits that she is up-to-date on all age-appropriate malignancy screenings.  #Asthma in adult, mild persistent, uncomplicated #Elevated eosinophil count This is a complicated case, and I am not sure exactly what is causing her elevated eosinophil count. 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 However, her ANCA screen was negative, and she has no pulmonary-renal symptoms, no  petechial rash, no mononeuritis multiplex that would be more suggestive of this process. Message sent to patient's pulmonologist to see if this was high on their differential and recommended obtaining HRCT for further evaluation. Will also obtain U/A and UPC to see if this provides any additional information.  Orders: Orders Placed This Encounter  Procedures   Urinalysis   Protein / creatinine ratio, urine   Systemic Sclerosis (Scleroderma) 12 Antibodies Panel 2   Sjogren's syndrome antibods(ssa + ssb)   Anti-Smith antibody   Anti-DNA antibody, double-stranded   Jo-1 antibody-IgG   No orders of the defined types were placed in this encounter.  I personally spent a total of 60 minutes in the care of the patient today including preparing to see the patient, getting/reviewing separately obtained history, performing a medically appropriate exam/evaluation, counseling and educating, placing orders, referring and communicating with other health care professionals, and documenting clinical information in the EHR.   Follow-Up Instructions: No follow-ups on file.   Asberry Claw, DO

## 2024-07-14 LAB — JO-1 ANTIBODY-IGG: Jo-1 Autoabs: <1 AI

## 2024-07-14 LAB — SYSTEMIC SCLEROSIS (SCLERODERMA) 12 ANTIBODIES PANEL 2
Centromere protein A Ab: <11 SI (ref <11)
Centromere protein B Ab: <11 SI (ref <11)
Fibrillarin Ab: <11 SI (ref <11)
PM-SCL-100 Ab: <11 SI (ref <11)
PM-SCL-75 Ab: <11 SI (ref <11)
RNA polymerase III RP11 Ab: <11 SI (ref <11)
RNA polymerase III RP155 Ab: <11 SI (ref <11)
SCL-70 extractable nuclear Ab: 33 SI — ABNORMAL HIGH (ref <11)
Th-To Ab: <11 SI (ref <11)
U1 small nuclear ribonucleoprotein 70kD Ab: <11 SI (ref <11)
U1 small nuclear ribonucleoprotein A Ab: <11 SI (ref <11)
U1 small nuclear ribonucleoprotein C Ab: <11 SI (ref <11)

## 2024-07-14 LAB — URINALYSIS
Bilirubin Urine: NEGATIVE
Glucose, UA: NEGATIVE
Hgb urine dipstick: NEGATIVE
Leukocytes,Ua: NEGATIVE
Nitrite: NEGATIVE
Protein, ur: NEGATIVE
Specific Gravity, Urine: 1.016 (ref 1.001–1.035)
pH: 6 (ref 5.0–8.0)

## 2024-07-14 LAB — SJOGREN'S SYNDROME ANTIBODS(SSA + SSB)
SSA (Ro) (ENA) Antibody, IgG: 1 AI — AB
SSB (La) (ENA) Antibody, IgG: <1 AI

## 2024-07-14 LAB — PROTEIN / CREATININE RATIO, URINE
Creatinine, Urine: 81 mg/dL (ref 20–275)
Protein/Creat Ratio: 136 mg/g{creat} (ref 24–184)
Protein/Creatinine Ratio: 0.136 mg/mg{creat} (ref 0.024–0.184)
Total Protein, Urine: 11 mg/dL (ref 5–24)

## 2024-07-14 LAB — ANTI-SMITH ANTIBODY: ENA SM Ab Ser-aCnc: <1 AI

## 2024-07-14 LAB — ANTI-DNA ANTIBODY, DOUBLE-STRANDED: ds DNA Ab: <1 [IU]/mL

## 2024-07-16 ENCOUNTER — Ambulatory Visit: Admitting: Pulmonary Disease

## 2024-07-16 DIAGNOSIS — J8283 Eosinophilic asthma: Secondary | ICD-10-CM | POA: Diagnosis not present

## 2024-07-16 DIAGNOSIS — J45909 Unspecified asthma, uncomplicated: Secondary | ICD-10-CM

## 2024-07-16 LAB — PULMONARY FUNCTION TEST
DL/VA % pred: 113 %
DL/VA: 4.63 ml/min/mmHg/L
DLCO cor % pred: 97 %
DLCO cor: 21.88 ml/min/mmHg
DLCO unc % pred: 97 %
DLCO unc: 21.88 ml/min/mmHg
FEF 25-75 Post: 1.49 L/s
FEF 25-75 Pre: 1.22 L/s
FEF2575-%Change-Post: 21 %
FEF2575-%Pred-Post: 62 %
FEF2575-%Pred-Pre: 51 %
FEV1-%Change-Post: 7 %
FEV1-%Pred-Post: 84 %
FEV1-%Pred-Pre: 78 %
FEV1-Post: 2.36 L
FEV1-Pre: 2.2 L
FEV1FVC-%Change-Post: 3 %
FEV1FVC-%Pred-Pre: 82 %
FEV6-%Change-Post: 4 %
FEV6-%Pred-Post: 100 %
FEV6-%Pred-Pre: 96 %
FEV6-Post: 3.53 L
FEV6-Pre: 3.38 L
FEV6FVC-%Change-Post: 0 %
FEV6FVC-%Pred-Post: 103 %
FEV6FVC-%Pred-Pre: 102 %
FVC-%Change-Post: 3 %
FVC-%Pred-Post: 98 %
FVC-%Pred-Pre: 94 %
FVC-Post: 3.56 L
FVC-Pre: 3.43 L
Post FEV1/FVC ratio: 66 %
Post FEV6/FVC ratio: 100 %
Pre FEV1/FVC ratio: 64 %
Pre FEV6/FVC Ratio: 99 %
RV % pred: 116 %
RV: 2.62 L
TLC % pred: 108 %
TLC: 6.07 L

## 2024-07-16 NOTE — Progress Notes (Signed)
 Office Visit Note  Patient: Kathryn Myers             Date of Birth: April 17, 1961           MRN: [id number]             PCP: Joyce Norleen BROCKS, MD Referring: Joyce Norleen BROCKS, MD Visit Date: 07/18/2024  Subjective:  Abnormal Lab and Joint Pain   History of Present Illness: Kathryn Myers is a 63 y.o. female who presents for follow-up of abnormal lab results. Patient was last seen on 07/03/24 as a new patient for concerns regarding eosinophilic disease and positive ANA. 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 Results returned with positive ScL abs suggesting dx of Scleroderma.  Since her last visit, patient also reports issues with dysphagia, raising suspicion for underlying SSc. She had her HRCT and PFT's performed. 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 She admits to being on prednisone  when she had her PFT's performed.  She denies any other new symptoms at this time.    Activities of Daily Living:  Patient reports morning stiffness for 2 hours.   Patient Denies nocturnal pain.  Difficulty dressing/grooming: Denies Difficulty climbing stairs: Denies Difficulty getting out of chair: Denies Difficulty using hands for taps, buttons, cutlery, and/or writing: Denies  Review of Systems  Constitutional:  Negative for fatigue.  HENT:  Positive for trouble swallowing and trouble swallowing. Negative for mouth sores and mouth dryness.   Eyes:  Negative for dryness.  Respiratory:  Positive for shortness of breath.   Cardiovascular:  Negative for chest pain and palpitations.  Gastrointestinal:  Negative for blood in stool, constipation and diarrhea.  Endocrine: Negative for increased urination.  Genitourinary:  Negative for involuntary urination.  Musculoskeletal:  Positive for  joint pain, joint pain and morning stiffness. Negative for gait problem, joint swelling, myalgias, muscle weakness, muscle tenderness and myalgias.  Skin:  Negative for color change, rash, hair loss and sensitivity to sunlight.  Allergic/Immunologic: Negative for susceptible to infections.  Neurological:  Negative for dizziness and headaches.  Hematological:  Negative for swollen glands.  Psychiatric/Behavioral:  Negative for depressed mood and sleep disturbance. The patient is not nervous/anxious.     PMFS History:  Patient Active Problem List   Diagnosis Date Noted   Hiatal hernia 12/12/2022   Acute asthma exacerbation 11/05/2022   Hypokalemia 11/05/2022   Acute respiratory failure with hypoxia (HCC) 11/05/2022   Mild intermittent asthma with exacerbation 09/06/2022   Gastroesophageal reflux disease 09/06/2022   Enlarged lymph nodes 01/02/2020   Lymphadenopathy, inguinal 01/02/2020   Routine general medical examination at a health care facility 09/24/2017   Weight loss 09/24/2017   Screening for cervical cancer 09/24/2017   Vaccine counseling 09/24/2017   Non-recurrent unilateral inguinal hernia without obstruction or gangrene 09/24/2017   Cervical polyp 09/24/2017   Screen for STD (sexually transmitted disease) 09/24/2017   Encounter for health maintenance examination in adult 10/25/2015   Rhinitis, allergic 10/25/2015   Venereal disease contact 10/25/2015   Need for prophylactic vaccination and inoculation against influenza 10/25/2015   Edema 10/25/2015   BMI less than 19,adult 10/25/2015   LLQ abdominal tenderness 10/25/2015   Osteopenia 11/02/2014    Past Medical History:  Diagnosis Date   Allergic rhinitis    Eosinophilic asthma  Eosinophilic duodenitis    Femoral hernia of right side    Hemorrhoids    Hx of abnormal cervical Pap smear    Osteopenia 11/2012   repeat in 3-5 years   Raynaud phenomenon    Venereal disease contact    uses condoms, husband is HIV+    Wears glasses     Family History  Problem Relation Age of Onset   Hyperlipidemia Mother    Osteoporosis Mother    Parkinsonism Father    Hyperlipidemia Father    Hypertension Father    Heart disease Father    Multiple myeloma Father    Cancer Brother        bone cancer as a child   Osteoporosis Maternal Grandmother    Heart disease Maternal Grandfather    Breast cancer Maternal Aunt 54   Cancer Paternal Uncle        pancreatic   Stroke Neg Hx    Diabetes Neg Hx    Past Surgical History:  Procedure Laterality Date   COLONOSCOPY  12/2009   Dr. Rollin; sessile polyp, medium hemorrhoids, diverticulum   FEMORAL HERNIA REPAIR Right 10/22/2018   Procedure: RIGHT FEMORAL HERNIA REPAIR WITH MESH;  Surgeon: Belinda Cough, MD;  Location: [facility];  Service: General;  Laterality: Right;   INGUINAL HERNIA REPAIR  2008   left   INSERTION OF MESH Right 10/22/2018   Procedure: INSERTION OF MESH;  Surgeon: Belinda Cough, MD;  Location: [facility];  Service: General;  Laterality: Right;   SKIN BIOPSY Left 10/20/2021   compond nevus with moderate atypia   UPPER GI ENDOSCOPY     Social History   Tobacco Use   Smoking status: Former    Current packs/day: 0.00    Average packs/day: 0.3 packs/day for 10.0 years (2.5 ttl pk-yrs)    Types: Cigarettes    Start date: 10/26/1981    Quit date: 10/27/1991    Years since quitting: 32.7    Passive exposure: Past   Smokeless tobacco: Never  Vaping Use   Vaping status: Never Used  Substance Use Topics   Alcohol use: Yes    Alcohol/week: 7.0 standard drinks of alcohol    Types: 7 Glasses of wine per week    Comment: social   Drug use: No   Social History   Social History Narrative   Married, no children, exercise - walks the dog, travels a lot for work, Engineer, 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       Immunization History   Administered Date(s) Administered   DT (Pediatric) 12/05/1994, 06/07/2003   Hepatitis A 05/26/2005, 08/30/2012   Hepatitis B 05/26/2005, 06/23/2005, 12/25/2005   Influenza Inj Mdck Quad Pf 09/04/2017   Influenza Split 10/27/2010, 10/27/2011, 08/30/2012   Influenza, Seasonal, Injecte, Preservative Fre 08/21/2023   Influenza,inj,Quad PF,6+ Mos 10/27/2013, 11/02/2014, 10/25/2015, 09/27/2018, 08/08/2022   Influenza-Unspecified 09/04/2017, 08/25/2021   PFIZER(Purple Top)SARS-COV-2 Vaccination 01/10/2020, 02/03/2020, 10/14/2020, 05/12/2021   Pfizer(Comirnaty)Fall Seasonal Vaccine 12 years and older 09/06/2022, 08/21/2023   Tdap 08/30/2012, 10/09/2022     Objective: Vital Signs: BP 139/84 (BP Location: Left Arm, Patient Position: Sitting, Cuff Size: Small)   Pulse (!) 59   Temp 98.2 F (36.8 C)   Resp 12   Ht 5' 7 (1.702 m)   Wt 101 lb 9.6 oz (46.1 kg)   LMP 07/28/2011   BMI 15.91 kg/m    Physical Exam Vitals  and nursing note reviewed.  HENT:     Head: Normocephalic and atraumatic.     Nose: Nose normal.  Eyes:     Conjunctiva/sclera: Conjunctivae normal.     Pupils: Pupils are equal, round, and reactive to light.  Cardiovascular:     Rate and Rhythm: Normal rate and regular rhythm.     Heart sounds: Normal heart sounds.  Pulmonary:     Effort: Pulmonary effort is normal.     Breath sounds: Normal breath sounds.  Skin:    General: Skin is warm and dry.  Neurological:     Mental Status: She is alert. Mental status is at baseline.  Psychiatric:        Mood and Affect: Mood normal.        Behavior: Behavior normal.      Musculoskeletal Exam:   CDAI Exam: CDAI Score: -- Patient Global: --; Provider Global: -- Swollen: 0 ; Tender: 0  Joint Exam 07/18/2024   All documented joints were normal     Investigation: No additional findings.  Imaging: No results found.  Recent Labs: Lab Results  Component Value Date   WBC 5.6 04/30/2024   HGB 14.5 04/30/2024    PLT 274.0 04/30/2024   NA 137 09/08/2023   K 3.8 09/08/2023   CL 102 09/08/2023   CO2 26 09/08/2023   GLUCOSE 169 (H) 09/08/2023   BUN 19 09/08/2023   CREATININE 0.86 09/08/2023   BILITOT 0.9 09/08/2023   ALKPHOS 40 09/08/2023   AST 19 09/08/2023   ALT 12 09/08/2023   PROT 6.7 09/08/2023   ALBUMIN 3.9 09/08/2023   CALCIUM 9.5 09/08/2023   GFRAA 79 11/08/2020    Speciality Comments: No specialty comments available.  Procedures:  No procedures performed Allergies: Patient has no known allergies.   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 However, her ANCA screen was negative (however 40% of patient's with EGPA have been reported to be negative) and she has no pulmonary-renal symptoms, no petechial rash, no mononeuritis multiplex that would be more suggestive of this process.   At this time, the differential includes EGPA or hypereosinophilic syndrome. The adult onset asthma would lean more towards an EGPA. HRCT and PFT ordered by pulmonology for further evaluation of any ILD to help further clarify diagnosis. At this time, I do think patient would benefit from biologic tx w/ either IL-4 or IL-5. I think she would likely benefit from IL-4. However, given her possible diagnosis of seronegative EGPA, I do think patient would also benefit from IL-5 for treatment. Message sent to patient's pulmonologist to see if he has a preference on how to proceed.  QuantiFERON-TB Gold Plus, Hepatitis B core antibody, IgM, Hepatitis B surface antigen, Hepatitis C antibody chevcked today in the event that  [place] (IL-5) is initiated.   Scleroderma (HCC) Raynaud's disease without gangrene Patient with raynaud's syndrome, dysphagia, and positive anti-ScL70; all of which are concerning for a diagnosis of scleroderma.  Discussed with patient diagnosis of Scleroderma and ACR handout provided to patient. As discussed with patient, Scleroderma is a chronic autoimmune disease that can cause symptoms in various parts of the body. It causes inflammation  and tissues changes, especially fibrosis. It often leads to skin tightening and thickening, and can affect joints, muscles, heart, lungs, kidneys, blood vessels or intestines. Gicen that this disease can affect different organ systems, labs will be needed to monitor blood cell conuts, kidneys, and liver functions. A HRCT and PFT's will also be needed to evaluate lung function given risk of ILD, which has just been performed in this patient as discussed above. Also discussed risk of steroid use in this disorder, including rare manifestation called scleroderma renal crisis. Discussed with patient that she needs to monitor herself carefully given current prednisone  taper for her previously discussed symptoms. Advised her to contact her doctor or report to the hospital immediately if she develops any new s/s of elevated blood pressure such as headache, dizziness, changes in urination.   Orders: Orders Placed This Encounter  Procedures   QuantiFERON-TB Gold Plus   Hepatitis B core antibody, IgM   Hepatitis B surface antigen   Hepatitis C antibody   No orders of the defined types were placed in this encounter.  I personally spent a total of 60 minutes in the care of the patient today including preparing to see the patient, getting/reviewing separately obtained history, performing a medically appropriate exam/evaluation, counseling and educating, placing orders, referring and communicating with other health care professionals, documenting clinical information in the  EHR, independently interpreting results, and communicating results.   Follow-Up Instructions: Return in about 2 months (around 09/17/2024).   Asberry Claw, DO

## 2024-07-16 NOTE — Progress Notes (Signed)
 Full PFT performed today.

## 2024-07-16 NOTE — Patient Instructions (Signed)
 Full PFT performed today.

## 2024-07-17 ENCOUNTER — Ambulatory Visit (HOSPITAL_COMMUNITY)
Admission: RE | Admit: 2024-07-17 | Discharge: 2024-07-17 | Disposition: A | Discharge status: Home / Self Care | Source: Ambulatory Visit | Attending: Pulmonary Disease | Admitting: Pulmonary Disease

## 2024-07-17 DIAGNOSIS — R768 Other specified abnormal immunological findings in serum: Secondary | ICD-10-CM | POA: Diagnosis present

## 2024-07-17 DIAGNOSIS — J8283 Eosinophilic asthma: Secondary | ICD-10-CM | POA: Insufficient documentation

## 2024-07-18 ENCOUNTER — Ambulatory Visit

## 2024-07-18 VITALS — BP 139/84 | HR 59 | Temp 98.2°F | Resp 12 | Ht 67.0 in | Wt 101.6 lb

## 2024-07-18 DIAGNOSIS — J4530 Mild persistent asthma, uncomplicated: Secondary | ICD-10-CM | POA: Diagnosis not present

## 2024-07-18 DIAGNOSIS — M301 Polyarteritis with lung involvement [Churg-Strauss]: Secondary | ICD-10-CM

## 2024-07-18 DIAGNOSIS — R768 Other specified abnormal immunological findings in serum: Secondary | ICD-10-CM | POA: Diagnosis not present

## 2024-07-18 DIAGNOSIS — R898 Other abnormal findings in specimens from other organs, systems and tissues: Secondary | ICD-10-CM

## 2024-07-18 DIAGNOSIS — I7300 Raynaud's syndrome without gangrene: Secondary | ICD-10-CM | POA: Diagnosis not present

## 2024-07-18 DIAGNOSIS — M349 Systemic sclerosis, unspecified: Secondary | ICD-10-CM

## 2024-07-18 DIAGNOSIS — D7218 Eosinophilia in diseases classified elsewhere: Secondary | ICD-10-CM

## 2024-07-22 LAB — QUANTIFERON-TB GOLD PLUS
Mitogen-NIL: 1.89 [IU]/mL
NIL: 0.02 [IU]/mL
QuantiFERON-TB Gold Plus: NEGATIVE
TB1-NIL: 0 [IU]/mL
TB2-NIL: 0 [IU]/mL

## 2024-07-22 LAB — HEPATITIS C ANTIBODY: Hepatitis C Ab: NONREACTIVE

## 2024-07-22 LAB — HEPATITIS B SURFACE ANTIGEN: Hepatitis B Surface Ag: NONREACTIVE

## 2024-07-22 LAB — HEPATITIS B CORE ANTIBODY, IGM: Hep B C IgM: NONREACTIVE

## 2024-07-26 ENCOUNTER — Ambulatory Visit: Payer: Self-pay | Admitting: Pulmonary Disease

## 2024-07-31 ENCOUNTER — Encounter: Payer: Self-pay | Admitting: Pulmonary Disease

## 2024-07-31 ENCOUNTER — Telehealth: Payer: Self-pay | Admitting: Pulmonary Disease

## 2024-07-31 ENCOUNTER — Ambulatory Visit: Admitting: Pulmonary Disease

## 2024-07-31 VITALS — BP 116/62 | HR 65 | Ht 67.0 in | Wt 104.2 lb

## 2024-07-31 DIAGNOSIS — J45909 Unspecified asthma, uncomplicated: Secondary | ICD-10-CM

## 2024-07-31 DIAGNOSIS — J8283 Eosinophilic asthma: Secondary | ICD-10-CM

## 2024-07-31 DIAGNOSIS — K219 Gastro-esophageal reflux disease without esophagitis: Secondary | ICD-10-CM

## 2024-07-31 DIAGNOSIS — K2980 Duodenitis without bleeding: Secondary | ICD-10-CM | POA: Diagnosis not present

## 2024-07-31 DIAGNOSIS — M349 Systemic sclerosis, unspecified: Secondary | ICD-10-CM

## 2024-07-31 DIAGNOSIS — D7218 Eosinophilia in diseases classified elsewhere: Secondary | ICD-10-CM | POA: Diagnosis not present

## 2024-07-31 NOTE — Patient Instructions (Signed)
 We will work on getting you setup with dupixent injections for your asthma and eosinophilic duodenitis with the prior applications  Continue Breztri  inhaler 2 puffs twice daily as needed - rinse mouth  Continue over the counter allergy  medicine  Follow up in 3 months

## 2024-07-31 NOTE — Telephone Encounter (Signed)
 [facility] Pharmacy Team,  Patient is ready to start dupixent therapy after meeting with rheumatology. Are we able able to get her re-qualified with the prior application?   Thanks, JD

## 2024-07-31 NOTE — Progress Notes (Unsigned)
 Synopsis: Acute visit  Subjective:   PATIENT ID: Kathryn Myers GENDER: female DOB: 1961-03-22, MRN: [id number]  HPI  Chief Complaint  Patient presents with   Medical Management of Chronic Issues   Diahann Guajardo is a 63 year old woman, former smoker with GERD and asthma who returns to pulmonary clinic for acute visit.   OV 11/02/23 Her regimen includes breztri  2 puffs twice daily with spacer, albuterol  inhaler as needed and sinus rinses as needed. She resumed Breztri  inhaler 1 week ago when she had increase in cough, mucous production and dyspnea. She had stopped breztri  a few months before as she was doing well.   She  presents with a recurrent cough and shortness of breath, similar to an episode last December that resulted in hospitalization for an acute asthma attack. The patient reports that their symptoms began with reflux last year, which has led to chronic sinus congestion. The patient's breathing difficulties started to worsen in early December, leading to wheezing and sinus discomfort.  The patient's asthma had been well-controlled since March, with no need for the Breztri  inhaler or nebulizer. However, around Thanksgiving, the patient began experiencing reflux flare-ups, and within the last three weeks, the patient's breathing difficulties have significantly worsened. The patient is now using the nebulizer every four hours, even waking up in the middle of the night due to breathlessness. The patient is currently on medication for reflux and has been using the Breztri  inhaler and nebulizer for about a week due to the recent exacerbation of their symptoms. The patient also reports coughing up stringy mucus and experiencing foamy cough at night, likely related to reflux.  The patient also reports sinus congestion, either being stopped up or having a runny nose. The patient has a history of colitis and had an infection in part of their intestine in late October to early November. In  early December, the patient had a mild shingles flare-up. 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  The patient has had two suspected episodes of COVID-19, one in early 2020 and another around Christmas the same year.   OV 04/30/24 Eosinophil levels have been consistently high, with blood work in March and early April confirming this. She experiences recurrent vomiting episodes, often at night, since March of last year. She had EGD and colonosocpy in April which  showed eosinophilic inflammation in the duodenum but not stomach or colon.  During flare-ups, she experiences diarrhea and mucus production. Her breathing was stable over the summer, with no respiratory symptoms reported. She uses prednisone  during flare-ups and is concerned about long-term steroid use due to osteopenia and a family history of osteoporosis. She takes Protonix  for acid reflux.  OV 07/31/24 She has been doing well since last visit. Discussed her care with rheumatology with plans to start dupixent or mepolizumab for her eosinophilia and respiratory symptoms. There is concern for scleroderma and avoiding steroids would be best.   Her current medications include Breztri , used once or twice daily, and a nebulizer during exacerbations. She takes Zyrtec daily for allergies, which has improved her nasal symptoms. She has reduced Protonix  use.  Recent pulmonary function tests indicate stable lung function with slight improvement in FEV1. HRCT Chest does not indicate ILD involvement. Humidity worsens her respiratory symptoms, but she experienced relief in low humidity conditions at the beach.  Past Medical History:  Diagnosis Date   Allergic rhinitis    Eosinophilic  asthma    Eosinophilic duodenitis    Femoral hernia of right side    Hemorrhoids    Hx of abnormal cervical Pap smear    Osteopenia 11/2012   repeat in 3-5 years   Raynaud phenomenon    Venereal disease  contact    uses condoms, husband is HIV+   Wears glasses      Family History  Problem Relation Age of Onset   Hyperlipidemia Mother    Osteoporosis Mother    Parkinsonism Father    Hyperlipidemia Father    Hypertension Father    Heart disease Father    Multiple myeloma Father    Cancer Brother        bone cancer as a child   Osteoporosis Maternal Grandmother    Heart disease Maternal Grandfather    Breast cancer Maternal Aunt 16   Cancer Paternal Uncle        pancreatic   Stroke Neg Hx    Diabetes Neg Hx      Social History   Socioeconomic History   Marital status: Married    Spouse name: Not on file   Number of children: Not on file   Years of education: Not on file   Highest education level: Not on file  Occupational History   Occupation: Engineer, manufacturing systems    Employer: [employer]  Tobacco Use   Smoking status: Former    Current packs/day: 0.00    Average packs/day: 0.3 packs/day for 10.0 years (2.5 ttl pk-yrs)    Types: Cigarettes    Start date: 10/26/1981    Quit date: 10/27/1991    Years since quitting: 32.7    Passive exposure: Past   Smokeless tobacco: Never  Vaping Use   Vaping status: Never Used  Substance and Sexual Activity   Alcohol use: Yes    Alcohol/week: 7.0 standard drinks of alcohol    Types: 7 Glasses of wine per week    Comment: social   Drug use: No   Sexual activity: Yes    Birth control/protection: Post-menopausal    Comment: 1st intercourse 47 yo-5 partners  Other Topics Concern   Not on file  Social History Narrative   Married, no children, exercise - walks the dog, travels a lot for work, Engineer, 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  Travels to [place], [place], [place] .     Social Drivers of Corporate investment banker Strain: Not on file  Food Insecurity: No Food Insecurity (11/05/2022)   Hunger Vital Sign    Worried About Running Out of Food in the Last Year: Never true    Ran Out of  Food in the Last Year: Never true  Transportation Needs: No Transportation Needs (11/05/2022)   PRAPARE - Administrator, Civil Service (Medical): No    Lack of Transportation (Non-Medical): No  Physical Activity: Not on file  Stress: Not on file  Social Connections: Unknown (03/21/2022)   Received [facility]   Social Network    Social Network: Not on file  Intimate Partner Violence: Not At Risk (11/05/2022)   Humiliation, Afraid, Rape, and Kick questionnaire    Fear of Current or Ex-Partner: No    Emotionally Abused: No    Physically Abused: No    Sexually Abused: No     No Known Allergies   Outpatient Medications Prior to Visit  Medication Sig Dispense Refill   albuterol  (VENTOLIN  HFA) 108 (90 Base) MCG/ACT inhaler Inhale  2 puffs into the lungs every 6 (six) hours as needed for wheezing or shortness of breath. 8 g 11   Budeson-Glycopyrrol-Formoterol (BREZTRI  AEROSPHERE) 160-9-4.8 MCG/ACT AERO Inhale 2 puffs into the lungs in the morning and at bedtime. 1 each 11   Calcium Carbonate-Vitamin D  600-200 MG-UNIT TABS Take 2 tablets by mouth daily.     docusate sodium  (COLACE) 100 MG capsule Take 100 mg by mouth daily as needed for mild constipation.     famotidine  (PEPCID ) 20 MG tablet Take 1 tablet (20 mg total) by mouth daily. (Patient taking differently: Take 20 mg by mouth as needed.) 30 tablet 2   ipratropium-albuterol  (DUONEB) 0.5-2.5 (3) MG/3ML SOLN Take 3 mLs by nebulization every 6 (six) hours as needed. 360 mL 3   Multiple Vitamin (MULTIVITAMIN ADULT PO) Take 1 tablet by mouth daily.     Omega-3 Fatty Acids (SALMON OIL PO) Take 1 capsule by mouth daily.     pantoprazole  (PROTONIX ) 40 MG tablet Take 1 tablet (40 mg total) by mouth daily. 90 tablet 3   predniSONE  (DELTASONE ) 10 MG tablet Take 40 mg by mouth daily. (Patient not taking: Reported on 07/31/2024)     No facility-administered medications prior to visit.   Review of Systems  Constitutional:   Negative for chills, fever, malaise/fatigue and weight loss.  HENT:  Negative for congestion, sinus pain and sore throat.   Eyes: Negative.   Respiratory:  Negative for cough, hemoptysis, sputum production, shortness of breath and wheezing.   Cardiovascular:  Negative for chest pain, palpitations, orthopnea, claudication and leg swelling.  Gastrointestinal:  Negative for abdominal pain, heartburn, nausea and vomiting.  Genitourinary: Negative.   Musculoskeletal:  Negative for joint pain and myalgias.  Skin:  Negative for rash.  Neurological:  Negative for weakness.  Endo/Heme/Allergies: Negative.   Psychiatric/Behavioral: Negative.      Objective:   Vitals:   07/31/24 1445  BP: 116/62  Pulse: 65  SpO2: 100%  Weight: 104 lb 3.2 oz (47.3 kg)  Height: 5' 7 (1.702 m)   Physical Exam Constitutional:      General: She is not in acute distress.    Appearance: Normal appearance.  Eyes:     General: No scleral icterus.    Conjunctiva/sclera: Conjunctivae normal.  Cardiovascular:     Rate and Rhythm: Normal rate and regular rhythm.  Pulmonary:     Breath sounds: No wheezing, rhonchi or rales.  Musculoskeletal:     Right lower leg: No edema.     Left lower leg: No edema.  Skin:    General: Skin is warm and dry.  Neurological:     General: No focal deficit present.    CBC    Component Value Date/Time   WBC 5.6 04/30/2024 1350   RBC 4.40 04/30/2024 1350   HGB 14.5 04/30/2024 1350   HGB 15.5 11/08/2020 1234   HCT 43.2 04/30/2024 1350   HCT 45.7 11/08/2020 1234   PLT 274.0 04/30/2024 1350   PLT 272 11/08/2020 1234   MCV 98.0 04/30/2024 1350   MCV 96 11/08/2020 1234   MCH 33.6 09/08/2023 1624   MCHC 33.5 04/30/2024 1350   RDW 13.6 04/30/2024 1350   RDW 12.3 11/08/2020 1234   LYMPHSABS 2.5 04/30/2024 1350   LYMPHSABS 2.6 11/08/2020 1234   MONOABS 0.3 04/30/2024 1350   EOSABS 0.3 04/30/2024 1350   EOSABS 0.2 11/08/2020 1234   BASOSABS 0.1 04/30/2024 1350   BASOSABS  0.1 11/08/2020 1234  Latest Ref Rng & Units 09/08/2023    4:24 PM 11/07/2022    4:04 AM 11/06/2022    4:32 AM  BMP  Glucose 70 - 99 mg/dL 830  899  836   BUN 8 - 23 mg/dL 19  27  17    Creatinine 0.44 - 1.00 mg/dL 9.13  9.14  9.36   Sodium 135 - 145 mmol/L 137  134  138   Potassium 3.5 - 5.1 mmol/L 3.8  4.0  4.4   Chloride 98 - 111 mmol/L 102  104  108   CO2 22 - 32 mmol/L 26  23  22    Calcium 8.9 - 10.3 mg/dL 9.5  8.4  8.7    Chest imaging: CXR 11/02/23 Reticulonodular opacities overlying the cardiac silhouette on the lateral view, potentially broncho pneumonia versus scarring.   PFT:    Latest Ref Rng & Units 07/16/2024    3:53 PM 01/25/2023    1:46 PM  PFT Results  FVC-Pre L 3.43  3.21   FVC-Predicted Pre % 94  86   FVC-Post L 3.56  3.45   FVC-Predicted Post % 98  92   Pre FEV1/FVC % % 64  39   Post FEV1/FCV % % 66  59   FEV1-Pre L 2.20  1.26   FEV1-Predicted Pre % 78  43   FEV1-Post L 2.36  2.05   DLCO uncorrected ml/min/mmHg 21.88  22.18   DLCO UNC% % 97  97   DLCO corrected ml/min/mmHg 21.88  22.18   DLCO COR %Predicted % 97  97   DLVA Predicted % 113  104   TLC L 6.07  5.86   TLC % Predicted % 108  103   RV % Predicted % 116  111     Labs:  Path:  Echo:  Heart Catheterization:     Assessment & Plan:   Eosinophilic asthma  Eosinophilic duodenitis  Scleroderma (HCC)  Discussion: Ashey Tramontana is a 63 year old woman, former smoker with GERD and asthma who returns to pulmonary clinic for asthma.  Eosinophilic Asthma -Continue Breztri  inhaler 2 puffs twice daily - plan to start Dupixent therapy  Eosinophilic Duodenitis EGD proven via biopsies. Episodes of Nausea vomiting and mucous stools. - Dupixent therapy as above will help with this - followed by Dr. Rollin of GI  Scleroderma - followed by rheumatology - no ILD involvement based on recent HRCT Chest  Gastroesophageal Reflux Disease (GERD) History of Laryngopharyngeal reflux (LPR) with  recent flare-ups. Symptoms include sinus congestion and foamy regurgitation at night. -Continue pantoprazole   Follow up in 3 months  Dorn Chill, MD [facility] Office: [phone]   Current Outpatient Medications:    albuterol  (VENTOLIN  HFA) 108 (90 Base) MCG/ACT inhaler, Inhale 2 puffs into the lungs every 6 (six) hours as needed for wheezing or shortness of breath., Disp: 8 g, Rfl: 11   Budeson-Glycopyrrol-Formoterol (BREZTRI  AEROSPHERE) 160-9-4.8 MCG/ACT AERO, Inhale 2 puffs into the lungs in the morning and at bedtime., Disp: 1 each, Rfl: 11   Calcium Carbonate-Vitamin D  600-200 MG-UNIT TABS, Take 2 tablets by mouth daily., Disp: , Rfl:    docusate sodium  (COLACE) 100 MG capsule, Take 100 mg by mouth daily as needed for mild constipation., Disp: , Rfl:    famotidine  (PEPCID ) 20 MG tablet, Take 1 tablet (20 mg total) by mouth daily. (Patient taking differently: Take 20 mg by mouth as needed.), Disp: 30 tablet, Rfl: 2   ipratropium-albuterol  (DUONEB) 0.5-2.5 (3)  MG/3ML SOLN, Take 3 mLs by nebulization every 6 (six) hours as needed., Disp: 360 mL, Rfl: 3   Multiple Vitamin (MULTIVITAMIN ADULT PO), Take 1 tablet by mouth daily., Disp: , Rfl:    Omega-3 Fatty Acids (SALMON OIL PO), Take 1 capsule by mouth daily., Disp: , Rfl:    pantoprazole  (PROTONIX ) 40 MG tablet, Take 1 tablet (40 mg total) by mouth daily., Disp: 90 tablet, Rfl: 3   predniSONE  (DELTASONE ) 10 MG tablet, Take 40 mg by mouth daily. (Patient not taking: Reported on 07/31/2024), Disp: , Rfl:

## 2024-08-01 ENCOUNTER — Encounter: Payer: Self-pay | Admitting: Pulmonary Disease

## 2024-08-01 NOTE — Telephone Encounter (Signed)
 Received notification from [facility] regarding a prior authorization for DUPIXENT. Authorization has been APPROVED from 05/01/2024 to 05/01/2025. Approval letter sent to scan center.   Unable to run test claim because patient must fill through [facility]: [phone]    Authorization # [id number]   Activated Dupixent copay card via phone today: ID: [id number] BIN: [id number] PCN: LOYALTY Group: [id number]   Text above documented 05/05/24  from prior benefits investigation for Dupixent  Routing to patient advocate team to ensure no further actions needed prior to new start scheduling.

## 2024-08-01 NOTE — Telephone Encounter (Signed)
 See telephone note 07/31/24 - plan to move forward with Dupixent after discussion with pulmonologist and rheumatologist.   ATC patient to schedule - LVMTCB.   Aleck Puls, PharmD, BCPS Clinical Pharmacist  [facility]

## 2024-08-01 NOTE — Telephone Encounter (Signed)
 Nothing further needed from advocate team

## 2024-08-05 NOTE — Telephone Encounter (Signed)
 ATC patient, second attempt.   LVM with mobile and home phone, left contact number to call back.   Aleck Puls, PharmD, BCPS Clinical Pharmacist  [facility]

## 2024-08-07 NOTE — Telephone Encounter (Signed)
 Spoke to patient - scheduled for new start Dupixent using sample on 08/14/24.   Aleck Puls, PharmD, BCPS, CPP Clinical Pharmacist  [facility]

## 2024-08-12 NOTE — Telephone Encounter (Signed)
 Dupixent new start appt is scheduled for 08/14/24

## 2024-08-14 ENCOUNTER — Ambulatory Visit (INDEPENDENT_AMBULATORY_CARE_PROVIDER_SITE_OTHER)

## 2024-08-14 DIAGNOSIS — Z7189 Other specified counseling: Secondary | ICD-10-CM | POA: Diagnosis not present

## 2024-08-14 DIAGNOSIS — J8283 Eosinophilic asthma: Secondary | ICD-10-CM

## 2024-08-14 MED ORDER — DUPIXENT 300 MG/2ML ~~LOC~~ SOAJ: 300.0000 mg | SUBCUTANEOUS | 3 refills | Status: DC

## 2024-08-14 NOTE — Progress Notes (Signed)
 HPI Patient presents today to [facility] to see pharmacy team for Dupixent new start.  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 Last seen by Dr. Kara on 07/31/24. At that time, plan to start Dupixent therapy.   There are 3 observed dispenses of prednisone  in available prescription dispense history in the last year.   Respiratory Medications Current regimen: Breztri  160-9-4.8 mcg/act (Inhale 2 puffs into the lungs in the morning and at bedtime), Ventolin  108 mcg/act (Inhale 2 puffs into the lungs every 6 (six) hours as needed for wheezing or shortness of breath)  Patient reports no known adherence challenges  OBJECTIVE No Known Allergies  Outpatient Encounter Medications as of 08/14/2024  Medication Sig Note   albuterol  (VENTOLIN  HFA) 108 (90 Base) MCG/ACT inhaler Inhale 2 puffs into the lungs every 6 (six) hours as needed for wheezing or shortness of breath.    Budeson-Glycopyrrol-Formoterol (BREZTRI  AEROSPHERE) 160-9-4.8 MCG/ACT AERO Inhale 2 puffs into the lungs in the morning and at bedtime.    Calcium Carbonate-Vitamin D  600-200 MG-UNIT TABS Take 2 tablets by mouth daily.    docusate sodium  (COLACE) 100 MG capsule Take 100 mg by mouth daily as needed for mild constipation. 10/16/2023: As needed   famotidine  (PEPCID ) 20 MG tablet Take 1 tablet (20 mg total) by mouth daily. (Patient taking differently: Take 20 mg by mouth as needed.)    ipratropium-albuterol  (DUONEB) 0.5-2.5 (3) MG/3ML SOLN Take 3 mLs by nebulization every 6 (six) hours as needed. 10/16/2023: As needed   Multiple Vitamin (MULTIVITAMIN ADULT PO) Take 1 tablet by mouth daily.    Omega-3 Fatty Acids (SALMON OIL PO) Take 1 capsule by mouth daily.    pantoprazole  (PROTONIX ) 40 MG  tablet Take 1 tablet (40 mg total) by mouth daily.    predniSONE  (DELTASONE ) 10 MG tablet Take 40 mg by mouth daily. (Patient not taking: Reported on 07/31/2024)    No facility-administered encounter medications on file as of 08/14/2024.     Immunization History  Administered Date(s) Administered   DT (Pediatric) 12/05/1994, 06/07/2003   Hepatitis A 05/26/2005, 08/30/2012   Hepatitis B 05/26/2005, 06/23/2005, 12/25/2005   Influenza Inj Mdck Quad Pf 09/04/2017   Influenza Split 10/27/2010, 10/27/2011, 08/30/2012   Influenza, Seasonal, Injecte, Preservative Fre 08/21/2023   Influenza,inj,Quad PF,6+ Mos 10/27/2013, 11/02/2014, 10/25/2015, 09/27/2018, 08/08/2022   Influenza-Unspecified 09/04/2017, 08/25/2021   PFIZER(Purple Top)SARS-COV-2 Vaccination 01/10/2020, 02/03/2020, 10/14/2020, 05/12/2021   Pfizer(Comirnaty)Fall Seasonal Vaccine 12 years and older 09/06/2022, 08/21/2023   Tdap 08/30/2012, 10/09/2022     PFTs    Latest Ref Rng & Units 07/16/2024    3:53 PM 01/25/2023    1:46 PM  PFT Results  FVC-Pre L 3.43  3.21   FVC-Predicted Pre % 94  86   FVC-Post L 3.56  3.45   FVC-Predicted Post % 98  92   Pre FEV1/FVC % % 64  39   Post FEV1/FCV % % 66  59   FEV1-Pre L 2.20  1.26   FEV1-Predicted Pre % 78  43   FEV1-Post L 2.36  2.05   DLCO uncorrected ml/min/mmHg 21.88  22.18   DLCO UNC% % 97  97   DLCO corrected ml/min/mmHg 21.88  22.18   DLCO COR %Predicted % 97  97   DLVA Predicted % 113  104   TLC L 6.07  5.86  TLC % Predicted % 108  103   RV % Predicted % 116  111      Eosinophils Most recent blood eosinophil count was 300 cells/microL taken on 04/30/24.   IgE: 121 on 11/05/23  Assessment   Biologics training for dupilumab (Dupixent)  Goals of therapy: Mechanism: human monoclonal IgG4 antibody that inhibits interleukin-4 and interleukin-13 cytokine-induced responses, including release of proinflammatory cytokines, chemokines, and IgE Reviewed that Dupixent is add-on  medication and patient must continue maintenance inhaler regimen. Response to therapy: may take 4 months to determine efficacy. Discussed that patients generally feel improvement sooner than 4 months.  Side effects: injection site reaction (6-18%), antibody development (5-16%), ophthalmic conjunctivitis (2-16%), transient blood eosinophilia (1-2%)  Dose: 600mg  at Week 0 (administered today in clinic) followed by 300mg  every 14 days thereafter  Administration/Storage:  Reviewed administration sites of thigh or abdomen (at least 2-3 inches away from abdomen). Reviewed the upper arm is only appropriate if caregiver is administering injection  Do not shake pen/syringe as this could lead to product foaming or precipitation. Do not use if solution is discolored or contains particulate matter or if window on prefilled pen is yellow (indicates pen has been used).  Reviewed storage of medication in refrigerator. Reviewed that Dupixent can be stored at room temperature in unopened carton for up to 14 days.  Access: Approval of Dupixent through: insurance Patient enrolled into copay card program to help with copay assistance. ID: [id number] BIN: [id number] PCN: LOYALTY Group: [id number]  Patient self-administered Dupixent 300mg /18ml x 2 (total dose 600mg ) in right upper thigh and left upper thigh using sample  Dupixent 300mg /59mL autoinjector pen NDC: [phone] Lot: [id number] Expiration: 2026-07-06  Of note, patient pulled back too soon on first injection and missed most of the first 300mg  pen injection. Second injection (300mg  pen) complete without issues. As a result, did not receive complete loading dose.  Patient monitored for 30 minutes for adverse reaction.  Patient tolerated well. Patient denies itchiness and irritation at injection.  Medication Reconciliation  A drug regimen assessment was performed, including review of allergies, interactions, disease-state management, dosing and immunization  history. Medications were reviewed with the patient, including name, instructions, indication, goals of therapy, potential side effects, importance of adherence, and safe use.   PLAN Continue Dupixent 300mg  every 14 days.  Next dose is due 08/28/24 and every 14 days thereafter. Rx sent to: [facility]: [phone] .  Patient provided with pharmacy phone number and advised to call later this week to schedule shipment to home. Patient provided with copay card information to provide to pharmacy. Continue maintenance inhaler regimen of: Breztri  160-9-4.8 mcg/act (Inhale 2 puffs into the lungs in the morning and at bedtime)  All questions encouraged and answered.  Instructed patient to reach out with any further questions or concerns.  Thank you for allowing pharmacy to participate in this patient's care.  This appointment required 45 minutes of patient care (this includes precharting, chart review, review of results, face-to-face care, etc.).

## 2024-08-14 NOTE — Patient Instructions (Addendum)
 Your next Dupixent dose is due on 08/28/24, 09/11/24, and every 14 days thereafter.  CONTINUE Breztri  160-9-4.8 mcg/act (Inhale 2 puffs into the lungs in the morning and at bedtime) and other medications as prescribed. Dupixent does NOT replace your other medications.  Your prescription will be shipped from [facility]. Their phone number is [phone]. Please call to schedule shipment and confirm address. They will mail your medication to your home.  Your copay should be affordable. If you call the pharmacy and it is not affordable, please double-check that they are billing through your copay card as secondary coverage. That copay card information is: ID: [id number] BIN: [id number] PCN: LOYALTY Group: [id number]  You will need to be seen by your provider in 3 to 4 months to assess how Dupixent is working for you. You have a follow-up appointment scheduled on 10/15/24 with Dr. Kara.   Stay up to date on all routine vaccines: influenza, pneumonia, COVID19, Shingles  How to manage an injection site reaction: Remember the 5 C's: COUNTER - leave on the counter at least 30 minutes but up to overnight to bring medication to room temperature. This may help prevent stinging COLD - place something cold (like an ice gel pack or cold water bottle) on the injection site just before cleansing with alcohol. This may help reduce pain CLARITIN - use Claritin (generic name is loratadine) for the first two weeks of treatment or the day of, the day before, and the day after injecting. This will help to minimize injection site reactions CORTISONE CREAM - apply if injection site is irritated and itching CALL ME - if injection site reaction is bigger than the size of your fist, looks infected, blisters, or if you develop hives

## 2024-08-21 NOTE — Telephone Encounter (Signed)
 Copied from CRM #[id number]. Topic: Clinical - Prescription Issue >> Aug 21, 2024  8:56 AM Nathanel DEL wrote: Reason for CRM: can you d a verbal for Dupilumab (DUPIXENT) 300 MG/2ML SOAJ?  It was supposed to be sent 10/09, they never received. Raqita [facility]/ [facility] on phone   Pharmacy please advise

## 2024-08-22 MED ORDER — DUPIXENT 300 MG/2ML ~~LOC~~ SOAJ: 300.0000 mg | SUBCUTANEOUS | 3 refills | Status: DC

## 2024-08-22 NOTE — Addendum Note (Signed)
 Addended by: DAYNE SHERRY RAMAN on: 08/22/2024 10:18 AM   Modules accepted: Orders

## 2024-08-22 NOTE — Telephone Encounter (Signed)
 Order electronically sent by [facility] on 08/22/24.

## 2024-08-25 ENCOUNTER — Telehealth: Payer: Self-pay | Admitting: *Deleted

## 2024-08-25 DIAGNOSIS — J8283 Eosinophilic asthma: Secondary | ICD-10-CM

## 2024-08-25 MED ORDER — DUPIXENT 300 MG/2ML ~~LOC~~ SOAJ: 300.0000 mg | SUBCUTANEOUS | 3 refills | Status: DC

## 2024-08-25 NOTE — Telephone Encounter (Signed)
 Copied from CRM #[id number]. Topic: Clinical - Prescription Issue >> Aug 21, 2024  8:56 AM Nathanel DEL wrote: Reason for CRM: can you d a verbal for Dupilumab (DUPIXENT) 300 MG/2ML SOAJ?  It was supposed to be sent 10/09, they never received. Raqita w/ [facility] on phone >> Aug 25, 2024  8:30 AM Joesph PARAS wrote: Natalie is calling to demand that DUPIXENT be sent. Tried to inform representative that according to chart note, prescription was sent by [facility] on 10/17. Representative, rude, states that there is obviously a disconnect and will not tolerate any further delays in getting this medication for the patient. Representative requested this be treated with utmost urgency, as patient will miss a dose if she does not have it by Thursday. Call-back number provided, please re-send rx or place verbal order. Representative demanding verbal order today.   C/B at [phone]

## 2024-08-25 NOTE — Telephone Encounter (Signed)
 Copied from CRM #[id number]. Topic: Clinical - Prescription Issue >> Aug 21, 2024  8:56 AM Nathanel DEL wrote: Reason for CRM: can you d a verbal for Dupilumab (DUPIXENT) 300 MG/2ML SOAJ?  It was supposed to be sent 10/09, they never received. Raqita w/ [facility] on phone >> Aug 25, 2024  8:30 AM Joesph PARAS wrote: Natalie is calling to demand that DUPIXENT be sent. Tried to inform representative that according to chart note, prescription was sent by [facility] on 10/17. Representative, rude, states that there is obviously a disconnect and will not tolerate any further delays in getting this medication for the patient. Representative requested this be treated with utmost urgency, as patient will miss a dose if she does not have it by Thursday. Call-back number provided, please re-send rx or place verbal order. Representative demanding verbal order today.   C/B at [phone]  I called and spoke with McKenzie and she verified that she did not have the prescription on file and she would connect me with a pharmacist.  I did advise her that whoever called earlier was very rude to our staff that answered the phone.  She apologized and said she could see that someone called earlier today.  I spoke with Merilee gower D.  Provided prescription in detail with refills.  Advised she has already received her loading dose on 08/14/24.  She will be provided with the pen.  Diagnosis code and NPI of provider provided. Nothing further needed.

## 2024-08-29 ENCOUNTER — Telehealth: Payer: Self-pay

## 2024-08-29 NOTE — Telephone Encounter (Signed)
 New Rx faxed to [facility]

## 2024-09-16 LAB — HM MAMMOGRAPHY

## 2024-09-16 NOTE — Progress Notes (Signed)
 "  Office Visit Note  Patient: Kathryn Myers             Date of Birth: 06-23-1961           MRN: [id number]             PCP: Joyce Norleen BROCKS, MD Referring: Joyce Norleen BROCKS, MD Visit Date: 09/18/2024 Occupation: Data Unavailable  Subjective:  Results  Discussed the use of AI scribe software for clinical note transcription with the patient, who gave verbal consent to proceed.  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 She was last seen 07/18/24 where discussion was had regarding new diagnosis and steps moving forward.  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 She has received three doses so far, with the initial dose administered at Dr. Froylan office and two subsequent doses at home. She feels better and has not needed to start prednisone , which she was considering prior to starting Dupixent . She has also reduced her use of the maintenance inhaler and has not experienced any cough or hemoptysis.  She has a history of eosinophilic duodenitis and was previously on antacid medications like Protonix  and famotidine , which she has stopped taking without noticing any adverse effects or return of symptoms.  She experiences Raynaud's phenomenon, which flared up during a recent cold snap, causing her fingers to turn white. She manages this by keeping her hands warm and has not required medication for it.  She has received her flu shot and COVID vaccine. She has a history of osteopenia and receives bone density scans during her company physicals, with the last one likely conducted a year ago.     Activities of Daily Living:  Patient reports morning stiffness for 5 minutes.   Patient Denies nocturnal pain.  Difficulty dressing/grooming: Denies Difficulty climbing stairs: Denies Difficulty getting out of chair: Denies Difficulty using hands  for taps, buttons, cutlery, and/or writing: Denies  No Rheumatology ROS completed.   PMFS History:  Patient Active Problem List   Diagnosis Date Noted   Hiatal hernia 12/12/2022   Acute asthma exacerbation 11/05/2022   Hypokalemia 11/05/2022   Acute respiratory failure with hypoxia (HCC) 11/05/2022   Mild intermittent asthma with exacerbation 09/06/2022   Gastroesophageal reflux disease 09/06/2022   Enlarged lymph nodes 01/02/2020   Lymphadenopathy, inguinal 01/02/2020   Routine general medical examination at a health care facility 09/24/2017   Weight loss 09/24/2017   Screening for cervical cancer 09/24/2017   Vaccine counseling 09/24/2017   Non-recurrent unilateral inguinal hernia without obstruction or gangrene 09/24/2017   Cervical polyp 09/24/2017   Screen for STD (sexually transmitted disease) 09/24/2017   Encounter for health maintenance examination in adult 10/25/2015   Rhinitis, allergic 10/25/2015   Venereal disease contact 10/25/2015   Need for prophylactic vaccination and inoculation against influenza 10/25/2015   Edema 10/25/2015   BMI less than 19,adult 10/25/2015   LLQ abdominal tenderness 10/25/2015   Osteopenia 11/02/2014    Past Medical History:  Diagnosis Date   Allergic rhinitis    Eosinophilic asthma    Eosinophilic duodenitis    Femoral hernia of right side    Hemorrhoids    Hx of abnormal cervical Pap smear    Osteopenia 11/2012   repeat in 3-5 years   Raynaud phenomenon    Venereal disease contact    uses condoms, husband is  HIV+   Wears glasses     Family History  Problem Relation Age of Onset   Hyperlipidemia Mother    Osteoporosis Mother    Parkinsonism Father    Hyperlipidemia Father    Hypertension Father    Heart disease Father    Multiple myeloma Father    Cancer Brother        bone cancer as a child   Osteoporosis Maternal Grandmother    Heart disease Maternal Grandfather    Breast cancer Maternal Aunt 41   Cancer Paternal  Uncle        pancreatic   Stroke Neg Hx    Diabetes Neg Hx    Past Surgical History:  Procedure Laterality Date   COLONOSCOPY  12/2009   Dr. Rollin; sessile polyp, medium hemorrhoids, diverticulum   FEMORAL HERNIA REPAIR Right 10/22/2018   Procedure: RIGHT FEMORAL HERNIA REPAIR WITH MESH;  Surgeon: Belinda Cough, MD;  Location: [facility];  Service: General;  Laterality: Right;   INGUINAL HERNIA REPAIR  2008   left   INSERTION OF MESH Right 10/22/2018   Procedure: INSERTION OF MESH;  Surgeon: Belinda Cough, MD;  Location: [facility];  Service: General;  Laterality: Right;   SKIN BIOPSY Left 10/20/2021   compond nevus with moderate atypia   UPPER GI ENDOSCOPY     Social History   Tobacco Use   Smoking status: Former    Current packs/day: 0.00    Average packs/day: 0.3 packs/day for 10.0 years (2.5 ttl pk-yrs)    Types: Cigarettes    Start date: 10/26/1981    Quit date: 10/27/1991    Years since quitting: 32.9    Passive exposure: Past   Smokeless tobacco: Never  Vaping Use   Vaping status: Never Used  Substance Use Topics   Alcohol use: Yes    Alcohol/week: 7.0 standard drinks of alcohol    Types: 7 Glasses of wine per week    Comment: social   Drug use: No   Social History   Social History Narrative   Married, no children, exercise - walks the dog, travels a lot for work, engineer, 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, out of town most of the time.  Travels to [place], [employer], [place] .       Immunization History  Administered Date(s) Administered   DT (Pediatric) 12/05/1994, 06/07/2003   Hepatitis A 05/26/2005, 08/30/2012   Hepatitis B 05/26/2005, 06/23/2005, 12/25/2005   Influenza Inj Mdck Quad Pf 09/04/2017   Influenza Split 10/27/2010, 10/27/2011, 08/30/2012   Influenza, Mdck, Trivalent,PF 6+ MOS(egg free) 08/17/2024   Influenza, Seasonal, Injecte, Preservative Fre 08/21/2023   Influenza,inj,Quad PF,6+ Mos 10/27/2013, 11/02/2014,  10/25/2015, 09/27/2018, 08/08/2022   Influenza-Unspecified 09/04/2017, 08/25/2021   PFIZER(Purple Top)SARS-COV-2 Vaccination 01/10/2020, 02/03/2020, 10/14/2020, 05/12/2021   Pfizer(Comirnaty)Fall Seasonal Vaccine 12 years and older 09/06/2022, 08/17/2024   Tdap 08/30/2012, 10/09/2022     Objective: Vital Signs: BP 124/77 (BP Location: Left Arm, Patient Position: Sitting, Cuff Size: Small)   Pulse 60   Temp (!) 97.3 F (36.3 C)   Resp 12   Ht 5' 7 (1.702 m)   Wt 105 lb 6.4 oz (47.8 kg)   LMP 07/28/2011   BMI 16.51 kg/m    Physical Exam Vitals and nursing note reviewed.  HENT:     Head: Normocephalic and atraumatic.     Nose: Nose normal.  Eyes:     Conjunctiva/sclera: Conjunctivae normal.     Pupils: Pupils are equal, round, and reactive to  light.  Cardiovascular:     Rate and Rhythm: Normal rate and regular rhythm.     Heart sounds: Normal heart sounds.  Pulmonary:     Effort: Pulmonary effort is normal. No respiratory distress.     Breath sounds: Normal breath sounds.  Skin:    General: Skin is warm and dry.  Neurological:     Mental Status: She is alert. Mental status is at baseline.  Psychiatric:        Mood and Affect: Mood normal.        Behavior: Behavior normal.      Musculoskeletal Exam:   CDAI Exam: CDAI Score: -- Patient Global: --; Provider Global: -- Swollen: 0 ; Tender: 0  Joint Exam 09/18/2024   All documented joints were normal     Investigation: No additional findings.  Imaging: No results found.  Recent Labs: Lab Results  Component Value Date   WBC 5.6 04/30/2024   HGB 14.5 04/30/2024   PLT 274.0 04/30/2024   NA 137 09/08/2023   K 3.8 09/08/2023   CL 102 09/08/2023   CO2 26 09/08/2023   GLUCOSE 169 (H) 09/08/2023   BUN 19 09/08/2023   CREATININE 0.86 09/08/2023   BILITOT 0.9 09/08/2023   ALKPHOS 40 09/08/2023   AST 19 09/08/2023   ALT 12 09/08/2023   PROT 6.7 09/08/2023   ALBUMIN 3.9 09/08/2023   CALCIUM 9.5 09/08/2023    GFRAA 79 11/08/2020   QFTBGOLDPLUS NEGATIVE 07/18/2024    Speciality Comments: No specialty comments available.  Procedures:  No procedures performed Allergies: Patient has no known allergies.   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 However, her ANCA screen was negative (however 40% of patient's with EGPA have been reported to be negative) and she has no pulmonary-renal symptoms, no petechial rash, no mononeuritis multiplex that would be more suggestive of this process.   At this time, the differential includes EGPA or hypereosinophilic syndrome. The adult onset asthma would lean more towards an EGPA. After discussion with patient and pulmonology, decision was made to pursue IL-4 (rather than IL-5). Patient s/p 3 doses w/ significant improvement in her symptoms. Patient advised to continue with current medications through pulmonology at this time, can always consider switching to an IL-5 in the future if symptoms return or worsen.  Scleroderma (HCC) Raynaud's disease without gangrene Patient with raynaud's syndrome, dysphagia, and positive anti-ScL70; all of which are concerning for a diagnosis of scleroderma. Patient's disease stable at this time.  Patient educated on treatment of raynaud's symptoms including conservative measures such as keeping her hands warm. Discussed medications can be added if symptoms progress.  Patient aware of risks associated with steroid use in this disorder, including rare manifestation called scleroderma renal crisis. Discussed with patient that she needs to  monitor herself carefully given current prednisone  taper for her previously discussed symptoms. Advised her to contact her doctor or report to the hospital immediately if she develops any new s/s of elevated blood pressure such as headache, dizziness, changes in urination.   Orders: No orders of the defined types were placed in this encounter.  No orders of the defined types were placed in this  encounter.   I personally spent a total of 40 minutes in the care of the patient today including preparing to see the patient, getting/reviewing separately obtained history, performing a medically appropriate exam/evaluation, counseling and educating, documenting clinical information in the EHR, and communicating results.   Follow-Up Instructions: Return in about 3 months (around 01/02/2025).   Asberry Claw, DO  Note - This record has been created using Animal nutritionist.  Chart creation errors have been sought, but may not always  have been located. Such creation errors do not reflect on  the standard of medical care. "

## 2024-09-17 ENCOUNTER — Encounter: Payer: Self-pay | Admitting: Family Medicine

## 2024-09-18 ENCOUNTER — Ambulatory Visit

## 2024-09-18 VITALS — BP 124/77 | HR 60 | Temp 97.3°F | Resp 12 | Ht 67.0 in | Wt 105.4 lb

## 2024-09-18 DIAGNOSIS — M349 Systemic sclerosis, unspecified: Secondary | ICD-10-CM

## 2024-09-18 DIAGNOSIS — R898 Other abnormal findings in specimens from other organs, systems and tissues: Secondary | ICD-10-CM | POA: Diagnosis not present

## 2024-09-18 DIAGNOSIS — M301 Polyarteritis with lung involvement [Churg-Strauss]: Secondary | ICD-10-CM

## 2024-09-18 DIAGNOSIS — I7300 Raynaud's syndrome without gangrene: Secondary | ICD-10-CM | POA: Diagnosis not present

## 2024-09-18 DIAGNOSIS — D7218 Eosinophilia in diseases classified elsewhere: Secondary | ICD-10-CM

## 2024-10-15 ENCOUNTER — Encounter: Payer: Self-pay | Admitting: Pulmonary Disease

## 2024-10-15 ENCOUNTER — Ambulatory Visit: Admitting: Pulmonary Disease

## 2024-10-15 VITALS — BP 100/70 | HR 58 | Temp 98.1°F | Ht 67.0 in | Wt 105.0 lb

## 2024-10-15 DIAGNOSIS — J4540 Moderate persistent asthma, uncomplicated: Secondary | ICD-10-CM

## 2024-10-15 NOTE — Progress Notes (Unsigned)
° °  Established Patient Pulmonology Office Visit   Subjective:  Patient ID: Kathryn Myers, female    DOB: 11/29/1960  MRN: [id number]  CC:  Chief Complaint  Patient presents with   Asthma    Breathing is better on Dupixent .    Discussed the use of AI scribe software for clinical note transcription with the patient, who gave verbal consent to proceed.  History of Present Illness       {PULM QUESTIONNAIRES (Optional):33196}  ROS  {History (Optional):23778}  Current Outpatient Medications:    albuterol  (VENTOLIN  HFA) 108 (90 Base) MCG/ACT inhaler, Inhale 2 puffs into the lungs every 6 (six) hours as needed for wheezing or shortness of breath., Disp: 8 g, Rfl: 11   Budeson-Glycopyrrol-Formoterol (BREZTRI  AEROSPHERE) 160-9-4.8 MCG/ACT AERO, Inhale 2 puffs into the lungs in the morning and at bedtime., Disp: 1 each, Rfl: 11   Calcium Carbonate-Vitamin D  600-200 MG-UNIT TABS, Take 2 tablets by mouth daily., Disp: , Rfl:    docusate sodium  (COLACE) 100 MG capsule, Take 100 mg by mouth daily as needed for mild constipation., Disp: , Rfl:    Dupilumab  (DUPIXENT ) 300 MG/2ML SOAJ, Inject 300 mg into the skin every 14 (fourteen) days. **loading dose completed in office on 08/14/24**, Disp: 4 mL, Rfl: 3   famotidine  (PEPCID ) 20 MG tablet, Take 1 tablet (20 mg total) by mouth daily. (Patient taking differently: Take 20 mg by mouth as needed.), Disp: 30 tablet, Rfl: 2   ipratropium-albuterol  (DUONEB) 0.5-2.5 (3) MG/3ML SOLN, Take 3 mLs by nebulization every 6 (six) hours as needed., Disp: 360 mL, Rfl: 3   Multiple Vitamin (MULTIVITAMIN ADULT PO), Take 1 tablet by mouth daily., Disp: , Rfl:    Omega-3 Fatty Acids (SALMON OIL PO), Take 1 capsule by mouth daily., Disp: , Rfl:    pantoprazole  (PROTONIX ) 40 MG tablet, Take 1 tablet (40 mg total) by mouth daily. (Patient taking differently: Take 40 mg by mouth as needed.), Disp: 90 tablet, Rfl: 3   predniSONE  (DELTASONE ) 10 MG tablet, Take 40 mg by  mouth daily. (Patient taking differently: Take 40 mg by mouth as needed.), Disp: , Rfl:       Objective:  BP 100/70 (BP Location: Left Arm, Patient Position: Sitting, Cuff Size: Normal)   Pulse (!) 58   Temp 98.1 F (36.7 C) (Oral)   Ht 5' 7 (1.702 m)   Wt 105 lb (47.6 kg)   LMP 07/28/2011   SpO2 100% Comment: RA  BMI 16.45 kg/m   {Pulm Vitals (Optional):32837}  Physical Exam   Diagnostic Review:  {Labs (Optional):32838}     Assessment & Plan:   Assessment & Plan   Assessment and Plan Assessment & Plan       No follow-ups on file.   Dorn KATHEE Chill, MD

## 2024-10-15 NOTE — Patient Instructions (Addendum)
 Continue dupixent  injections every two weeks  Continue Breztri  inhaler 2 puffs twice daily as needed - rinse mouth  Use albuterol  inhaler as needed  Follow up in 4 months, call sooner if needed

## 2024-10-24 ENCOUNTER — Encounter: Payer: Self-pay | Admitting: Pulmonary Disease

## 2024-11-17 ENCOUNTER — Other Ambulatory Visit: Payer: Self-pay | Admitting: Pulmonary Disease

## 2024-11-17 DIAGNOSIS — J8283 Eosinophilic asthma: Secondary | ICD-10-CM

## 2025-01-05 ENCOUNTER — Ambulatory Visit

## 2025-02-13 ENCOUNTER — Ambulatory Visit: Admitting: Pulmonary Disease
